# Patient Record
Sex: Female | Born: 1985 | Race: Black or African American | Hispanic: No | Marital: Single | Smoking: Current every day smoker
Health system: Southern US, Community
[De-identification: ages and names within clinical notes are randomized; demographics above are authoritative.]

## PROBLEM LIST (undated history)

## (undated) ENCOUNTER — Inpatient Hospital Stay (HOSPITAL_COMMUNITY): Payer: Self-pay

## (undated) DIAGNOSIS — F419 Anxiety disorder, unspecified: Secondary | ICD-10-CM

## (undated) DIAGNOSIS — A749 Chlamydial infection, unspecified: Secondary | ICD-10-CM

## (undated) HISTORY — PX: CHOLECYSTECTOMY, LAPAROSCOPIC: SHX56

## (undated) HISTORY — PX: CHOLECYSTECTOMY: SHX55

## (undated) HISTORY — PX: OTHER SURGICAL HISTORY: SHX169

---
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
## 2007-03-03 IMAGING — CR DG CHEST 2V
2 series · 2 of 2 positions shown · non-contrast
Comparison: none

CLINICAL DATA: Chest pain.  
 CHEST - 2 VIEW:
 Cardiomediastinal silhouette is unremarkable.  Mild peribronchial thickening is identified without focal air space disease.  No evidence of pleural effusion or pneumothorax.  The visualized bony thorax and upper abdomen are unremarkable.

[w chest pa *]
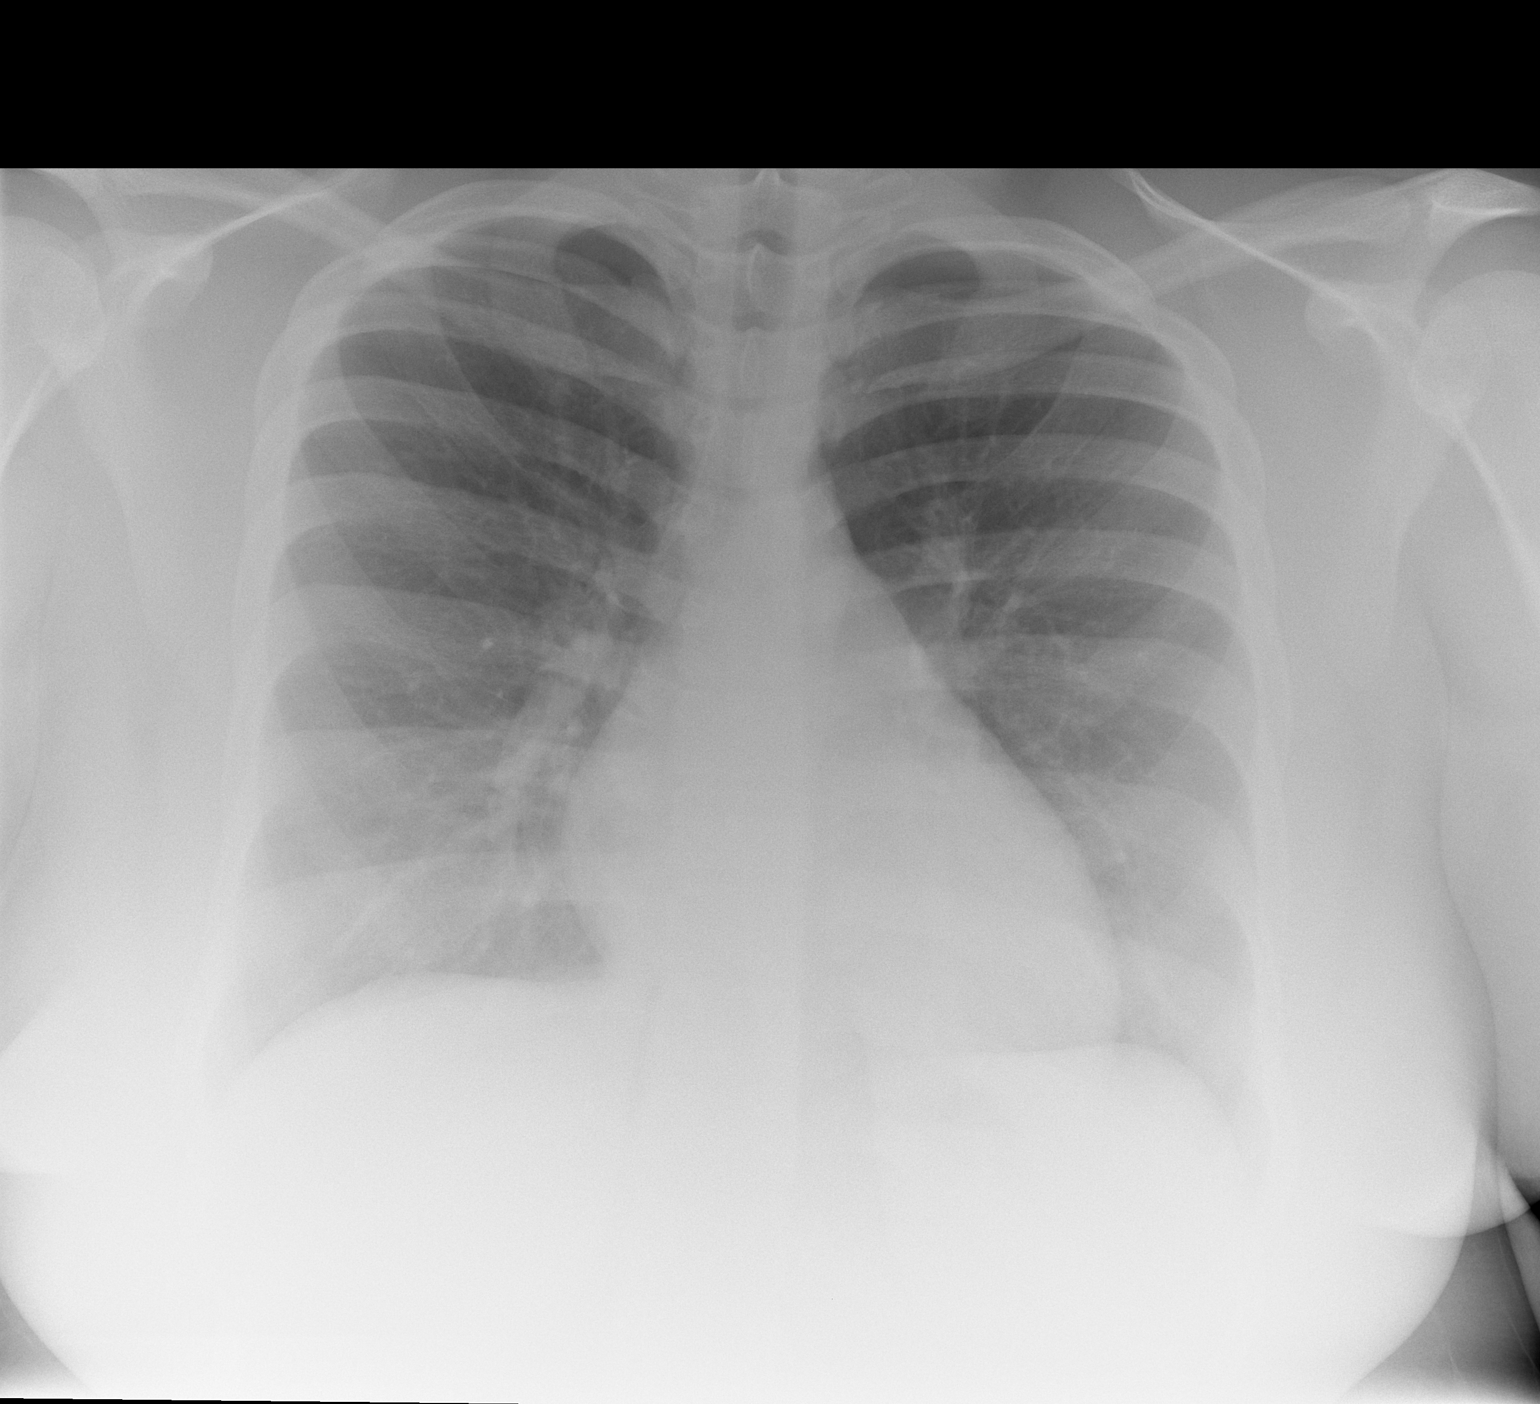

[w chest lat *]
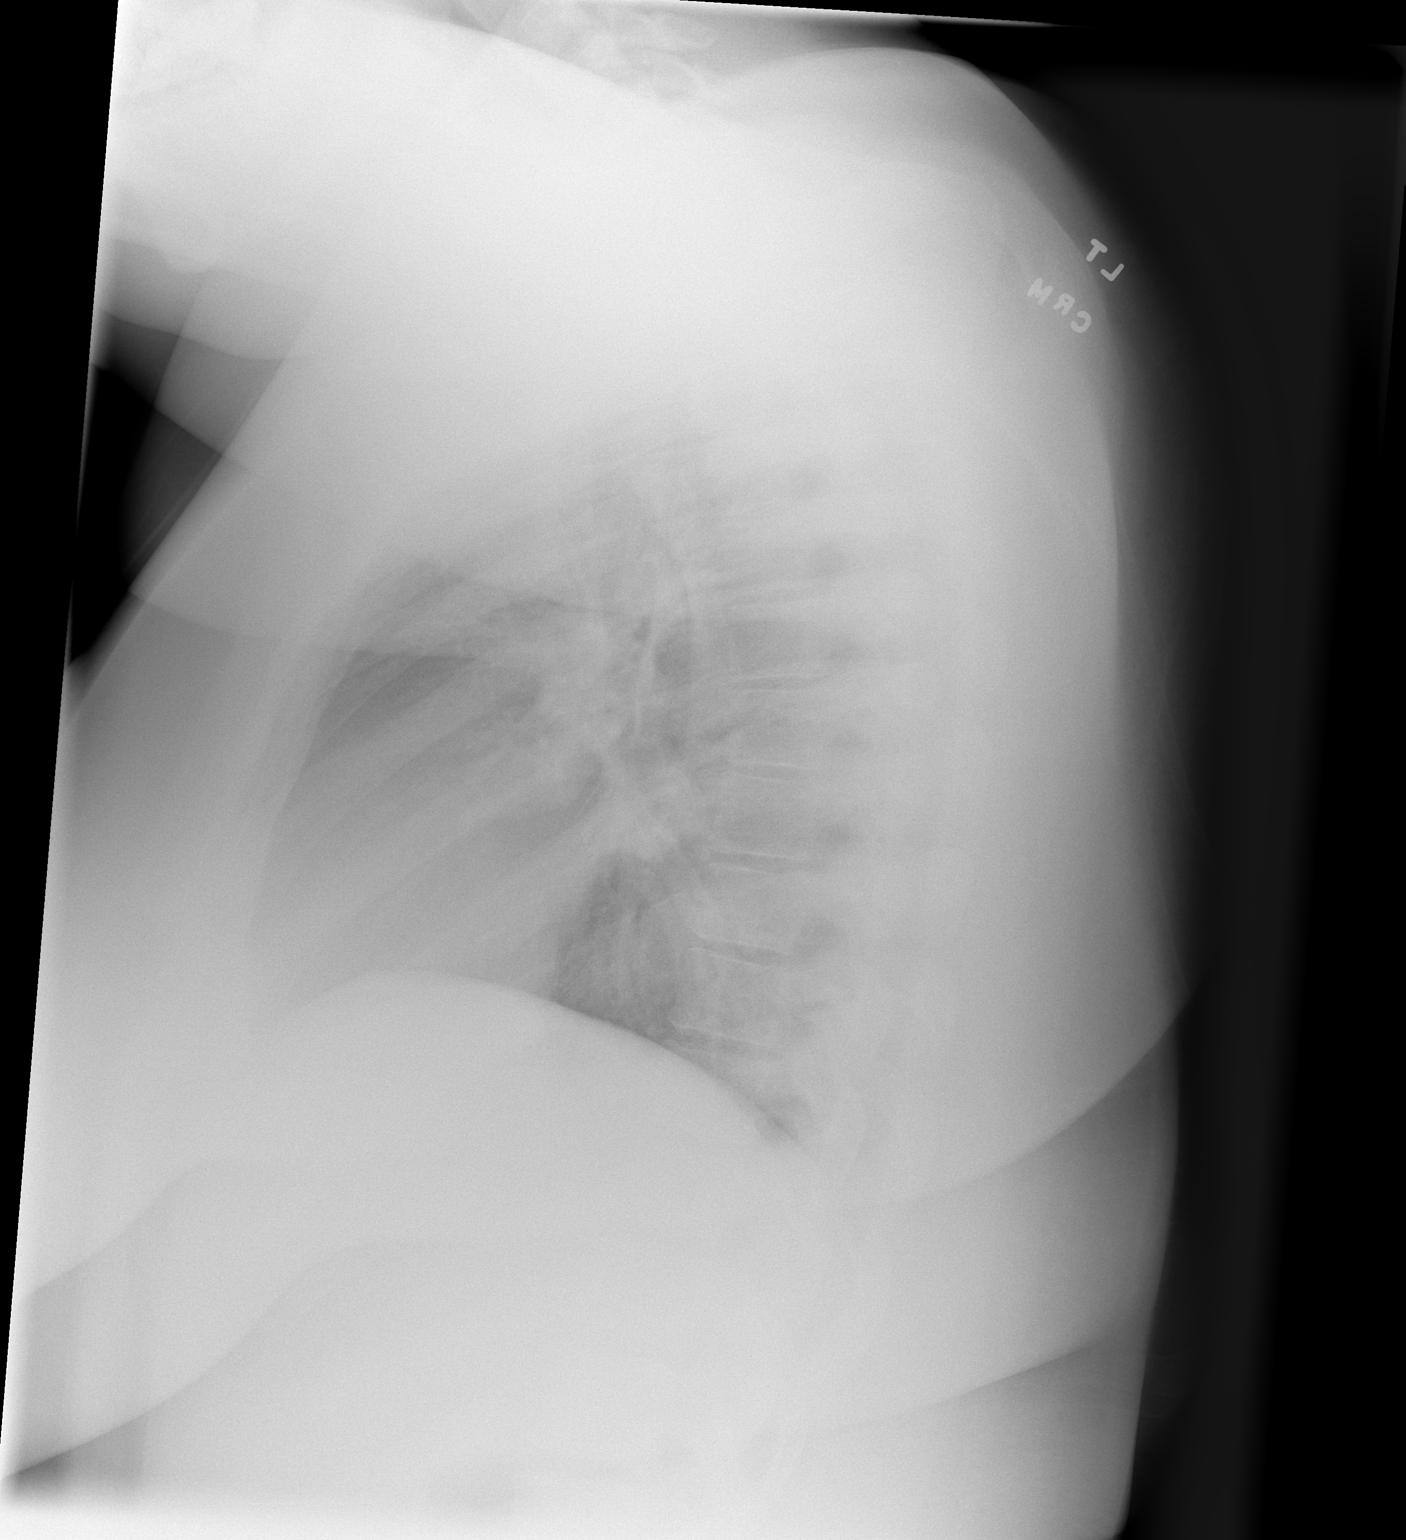

[2 of 2 positions shown; findings below may reference images not displayed]

IMPRESSION: Mild peribronchial thickening without focal air space disease.

## 2007-04-09 ENCOUNTER — Emergency Department (HOSPITAL_COMMUNITY): Admission: EM | Admit: 2007-04-09 | Discharge: 2007-04-09 | Payer: Self-pay | Admitting: *Deleted

## 2007-06-16 ENCOUNTER — Ambulatory Visit: Payer: Self-pay | Admitting: Family Medicine

## 2007-06-21 ENCOUNTER — Emergency Department (HOSPITAL_COMMUNITY): Admission: EM | Admit: 2007-06-21 | Discharge: 2007-06-21 | Payer: Self-pay | Admitting: Emergency Medicine

## 2007-08-22 ENCOUNTER — Emergency Department (HOSPITAL_COMMUNITY): Admission: EM | Admit: 2007-08-22 | Discharge: 2007-08-22 | Payer: Self-pay | Admitting: Emergency Medicine

## 2008-03-27 ENCOUNTER — Emergency Department (HOSPITAL_COMMUNITY): Admission: EM | Admit: 2008-03-27 | Discharge: 2008-03-28 | Payer: Self-pay | Admitting: Emergency Medicine

## 2008-05-25 ENCOUNTER — Inpatient Hospital Stay (HOSPITAL_COMMUNITY): Admission: AD | Admit: 2008-05-25 | Discharge: 2008-05-25 | Payer: Self-pay | Admitting: Family Medicine

## 2008-06-29 ENCOUNTER — Emergency Department (HOSPITAL_COMMUNITY): Admission: EM | Admit: 2008-06-29 | Discharge: 2008-06-29 | Payer: Self-pay | Admitting: Emergency Medicine

## 2009-01-12 ENCOUNTER — Inpatient Hospital Stay (HOSPITAL_COMMUNITY): Admission: AD | Admit: 2009-01-12 | Discharge: 2009-01-12 | Payer: Self-pay | Admitting: Obstetrics & Gynecology

## 2009-02-07 ENCOUNTER — Inpatient Hospital Stay (HOSPITAL_COMMUNITY): Admission: AD | Admit: 2009-02-07 | Discharge: 2009-02-07 | Payer: Self-pay | Admitting: Obstetrics & Gynecology

## 2009-03-14 ENCOUNTER — Ambulatory Visit (HOSPITAL_COMMUNITY): Admission: RE | Admit: 2009-03-14 | Discharge: 2009-03-14 | Payer: Self-pay | Admitting: Obstetrics

## 2009-06-16 ENCOUNTER — Inpatient Hospital Stay (HOSPITAL_COMMUNITY): Admission: AD | Admit: 2009-06-16 | Discharge: 2009-06-17 | Payer: Self-pay | Admitting: Obstetrics

## 2009-07-06 ENCOUNTER — Ambulatory Visit: Payer: Self-pay | Admitting: Physician Assistant

## 2009-07-06 ENCOUNTER — Inpatient Hospital Stay (HOSPITAL_COMMUNITY): Admission: AD | Admit: 2009-07-06 | Discharge: 2009-07-06 | Payer: Self-pay | Admitting: Obstetrics

## 2009-07-14 ENCOUNTER — Inpatient Hospital Stay (HOSPITAL_COMMUNITY): Admission: AD | Admit: 2009-07-14 | Discharge: 2009-07-14 | Payer: Self-pay | Admitting: Obstetrics

## 2009-07-22 ENCOUNTER — Inpatient Hospital Stay (HOSPITAL_COMMUNITY): Admission: AD | Admit: 2009-07-22 | Discharge: 2009-07-23 | Payer: Self-pay | Admitting: Obstetrics

## 2009-08-02 ENCOUNTER — Inpatient Hospital Stay (HOSPITAL_COMMUNITY): Admission: AD | Admit: 2009-08-02 | Discharge: 2009-08-02 | Payer: Self-pay | Admitting: Obstetrics

## 2009-08-06 ENCOUNTER — Inpatient Hospital Stay (HOSPITAL_COMMUNITY): Admission: AD | Admit: 2009-08-06 | Discharge: 2009-08-09 | Payer: Self-pay | Admitting: Obstetrics

## 2010-05-08 ENCOUNTER — Emergency Department (HOSPITAL_COMMUNITY): Admission: EM | Admit: 2010-05-08 | Discharge: 2010-05-08 | Payer: Self-pay | Admitting: Emergency Medicine

## 2010-06-19 ENCOUNTER — Emergency Department (HOSPITAL_COMMUNITY): Admission: EM | Admit: 2010-06-19 | Discharge: 2010-06-20 | Payer: Self-pay | Admitting: Emergency Medicine

## 2010-11-19 ENCOUNTER — Inpatient Hospital Stay (HOSPITAL_COMMUNITY)
Admission: AD | Admit: 2010-11-19 | Discharge: 2010-11-19 | Disposition: A | Discharge status: Home / Self Care | Payer: Medicaid Other | Source: Ambulatory Visit | Attending: Obstetrics and Gynecology | Admitting: Obstetrics and Gynecology

## 2010-11-19 DIAGNOSIS — N938 Other specified abnormal uterine and vaginal bleeding: Secondary | ICD-10-CM | POA: Insufficient documentation

## 2010-11-19 DIAGNOSIS — N949 Unspecified condition associated with female genital organs and menstrual cycle: Secondary | ICD-10-CM | POA: Insufficient documentation

## 2010-11-19 LAB — CBC
HCT: 34.8 % — ABNORMAL LOW (ref 36.0–46.0)
Hemoglobin: 11.4 g/dL — ABNORMAL LOW (ref 12.0–15.0)
MCH: 29.8 pg (ref 26.0–34.0)
MCHC: 32.8 g/dL (ref 30.0–36.0)
MCV: 91.1 fL (ref 78.0–100.0)
Platelets: 260 10*3/uL (ref 150–400)
RBC: 3.82 MIL/uL — ABNORMAL LOW (ref 3.87–5.11)
RDW: 12.5 % (ref 11.5–15.5)
WBC: 4.6 10*3/uL (ref 4.0–10.5)

## 2010-11-19 LAB — WET PREP, GENITAL
Trich, Wet Prep: NONE SEEN
Yeast Wet Prep HPF POC: NONE SEEN

## 2010-11-19 LAB — URINE MICROSCOPIC-ADD ON

## 2010-11-19 LAB — URINALYSIS, ROUTINE W REFLEX MICROSCOPIC
Bilirubin Urine: NEGATIVE
Glucose, UA: NEGATIVE mg/dL
Ketones, ur: NEGATIVE mg/dL
Leukocytes, UA: NEGATIVE
Nitrite: NEGATIVE
Protein, ur: NEGATIVE mg/dL
Specific Gravity, Urine: 1.025 (ref 1.005–1.030)
Urobilinogen, UA: 0.2 mg/dL (ref 0.0–1.0)
pH: 5.5 (ref 5.0–8.0)

## 2010-11-19 LAB — POCT PREGNANCY, URINE: Preg Test, Ur: NEGATIVE

## 2010-11-20 LAB — GC/CHLAMYDIA PROBE AMP, GENITAL
Chlamydia, DNA Probe: NEGATIVE
GC Probe Amp, Genital: NEGATIVE

## 2010-12-04 LAB — CBC
HCT: 30.1 % — ABNORMAL LOW (ref 36.0–46.0)
HCT: 34.9 % — ABNORMAL LOW (ref 36.0–46.0)
Hemoglobin: 10.2 g/dL — ABNORMAL LOW (ref 12.0–15.0)
Hemoglobin: 11.8 g/dL — ABNORMAL LOW (ref 12.0–15.0)
MCHC: 33.7 g/dL (ref 30.0–36.0)
MCHC: 33.9 g/dL (ref 30.0–36.0)
MCV: 93.8 fL (ref 78.0–100.0)
MCV: 94 fL (ref 78.0–100.0)
Platelets: 209 10*3/uL (ref 150–400)
Platelets: 242 10*3/uL (ref 150–400)
RBC: 3.21 MIL/uL — ABNORMAL LOW (ref 3.87–5.11)
RBC: 3.72 MIL/uL — ABNORMAL LOW (ref 3.87–5.11)
RDW: 13 % (ref 11.5–15.5)
RDW: 13 % (ref 11.5–15.5)
WBC: 11.5 10*3/uL — ABNORMAL HIGH (ref 4.0–10.5)
WBC: 9.7 10*3/uL (ref 4.0–10.5)

## 2010-12-04 LAB — URINALYSIS, ROUTINE W REFLEX MICROSCOPIC
Bilirubin Urine: NEGATIVE
Glucose, UA: NEGATIVE mg/dL
Ketones, ur: NEGATIVE mg/dL
Nitrite: NEGATIVE
Protein, ur: NEGATIVE mg/dL
Specific Gravity, Urine: 1.015 (ref 1.005–1.030)
Urobilinogen, UA: 0.2 mg/dL (ref 0.0–1.0)
pH: 6.5 (ref 5.0–8.0)

## 2010-12-04 LAB — URINE MICROSCOPIC-ADD ON

## 2010-12-04 LAB — CCBB MATERNAL DONOR DRAW

## 2010-12-04 LAB — RPR: RPR Ser Ql: NONREACTIVE

## 2010-12-06 LAB — URINALYSIS, ROUTINE W REFLEX MICROSCOPIC
Bilirubin Urine: NEGATIVE
Glucose, UA: NEGATIVE mg/dL
Ketones, ur: NEGATIVE mg/dL
Leukocytes, UA: NEGATIVE
Nitrite: NEGATIVE
Protein, ur: NEGATIVE mg/dL
Specific Gravity, Urine: 1.015 (ref 1.005–1.030)
Urobilinogen, UA: 0.2 mg/dL (ref 0.0–1.0)
pH: 7 (ref 5.0–8.0)

## 2010-12-06 LAB — URINE MICROSCOPIC-ADD ON

## 2010-12-06 LAB — URINE CULTURE: Colony Count: 10000

## 2010-12-06 LAB — CBC
MCHC: 34.3 g/dL (ref 30.0–36.0)
RBC: 3.32 MIL/uL — ABNORMAL LOW (ref 3.87–5.11)
WBC: 8.9 10*3/uL (ref 4.0–10.5)

## 2010-12-10 LAB — URINALYSIS, ROUTINE W REFLEX MICROSCOPIC
Glucose, UA: NEGATIVE mg/dL
Protein, ur: NEGATIVE mg/dL

## 2010-12-10 LAB — WET PREP, GENITAL

## 2010-12-11 LAB — ABO/RH: ABO/RH(D): O POS

## 2010-12-11 LAB — URINALYSIS, ROUTINE W REFLEX MICROSCOPIC
Bilirubin Urine: NEGATIVE
Glucose, UA: NEGATIVE mg/dL
Ketones, ur: NEGATIVE mg/dL
Nitrite: NEGATIVE
pH: 6.5 (ref 5.0–8.0)

## 2010-12-11 LAB — WET PREP, GENITAL
Trich, Wet Prep: NONE SEEN
Yeast Wet Prep HPF POC: NONE SEEN

## 2010-12-11 LAB — URINE MICROSCOPIC-ADD ON

## 2010-12-11 LAB — CBC
Hemoglobin: 11.8 g/dL — ABNORMAL LOW (ref 12.0–15.0)
RBC: 3.55 MIL/uL — ABNORMAL LOW (ref 3.87–5.11)

## 2010-12-11 LAB — GC/CHLAMYDIA PROBE AMP, GENITAL: GC Probe Amp, Genital: NEGATIVE

## 2011-01-18 NOTE — Op Note (Signed)
NAMEALFRIEDA, Bridget Wilson            ACCOUNT NO.:  [id number]   MEDICAL RECORD NO.:  [id number]          PATIENT TYPE:  AMB   LOCATION:  DAY                          FACILITY:  [facility]   PHYSICIAN:  Ollen Gross. Vernell Morgans, M.D. DATE OF BIRTH:  March 06, 1986   DATE OF PROCEDURE:  10/28/2005  DATE OF DISCHARGE:                                 OPERATIVE REPORT   PREOPERATIVE DIAGNOSIS:  Gallstones.   POSTOPERATIVE DIAGNOSIS:  Gallstones.   PROCEDURES:  Laparoscopic cholecystectomy with intraoperative cholangiogram.   SURGEON:  Ollen Gross. Carolynne Edouard, M.D.   ASSISTANT:  Sandria Bales. Ezzard Standing, M.D.   ANESTHESIA:  General endotracheal.   PROCEDURE:  After informed consent was obtained, the patient was brought to  the operating room, placed in a supine position on the operating room table.  After adequate induction of general anesthesia, the patient's abdomen was  prepped with Betadine and draped in the usual sterile manner. The area below  the umbilicus was infiltrated 0.25% Marcaine. A small incision was made with  a 15 blade knife. This incision was carried down through the subcutaneous  tissue bluntly with a hemostat and [employer] retractors until the linea alba  was identified. The linea alba was incised with a 15 blade knife and each  side was grasped with Kocher clamps and elevated anteriorly. The  preperitoneal space was then probed bluntly with a hemostat until the  peritoneum was opened and access was gained to the abdominal cavity. A #0  Vicryl pursestring stitch was placed in the fascia surround the opening. A  Hasson cannula was then placed through the opening and anchored in place to  the previously placed Vicryl pursestring stitch. The abdomen was then  insufflated with carbon dioxide without difficulty. The patient was placed  in the head-up position and rotated slightly with the right side up. The  laparoscope was then inserted through the Hasson cannula and right upper  quadrant was inspected.  The dome of the gallbladder and liver were readily  identified. Next the epigastric region was infiltrated with 0.25% Marcaine  and a small incision was made with a 15 blade knife and a 10 mm port was  placed bluntly through this incision in the abdominal cavity under direct  vision. Sites were then chosen laterally on the right side of the abdomen  for placement of 5 mm ports. Each of these areas were infiltrated with 0.25%  Marcaine. Small stab incisions were made with a 15 blade knife and 5 mm  ports were placed bluntly through these incisions into the abdominal cavity  under direct vision. A blunt grasper was then placed through the lateral-  most 5-mm port and used to grasp the dome of the gallbladder and elevated  anteriorly and superiorly. Another blunt grasper was placed through the  other 5 mm port and used to retract on the body and neck of the gallbladder.  A dissector was placed through the epigastric port and using the  electrocautery, the peritoneal reflection at the gallbladder neck area was  opened, blunt dissection was then carried out in this area until the  gallbladder neck cystic  duct junction was readily identified and a good  window was created. A single clip as placed on the gallbladder neck, a small  ductotomy was made just below the clip with the laparoscopic scissors. A 14  gauge Angiocath was then placed percutaneously through the anterior  abdominal wall under direct vision. A Reddick cholangiogram catheter was  then placed through the Angiocath and flushed. The Reddick catheter was then  placed within the cystic duct and anchored in place with a clip. A  cholangiogram was obtained that showed no filling defects, good emptying  into the duodenum and adequate length on the cystic duct. The anchoring clip  and catheters are removed from the patient. Two clips were placed proximally  on the cystic duct and the duct was divided between the 2 sets of clips.  Posterior  to this, the cystic artery was identified and again dissected  bluntly in a circumferential manner until a good window was created. Two  clips were placed proximally and one distally in the artery and the artery  was divided between the two. Next a laparoscopic hook cautery device was  used to separate the gallbladder from the liver bed. Prior to completely  detaching the gallbladder from liver bed, the liver bed was inspected and  several small bleeding points were coagulated with the electrocautery until  the area was completely hemostatic. The gallbladder was then detached the  rest of the way from the liver bed without difficulty with the hook  electrocautery. The laparoscope was then moved to the epigastric port and a  gallbladder grasper was placed through the Hasson cannula and used to grasp  the neck of the gallbladder. The gallbladder with the Hasson cannula was  removed through the infraumbilical port without difficulty. The gallbladder  did require opening and removal of several stones within the gallbladder  before it would completely pull through the infraumbilical opening. This was  done without difficulty. Once this was accomplished, the infraumbilical  incision was then irrigated with copious amounts of saline. The fascial  defect was closed with an interrupted #0 Vicryl as well as with the #0  Vicryl pursestring stitch. The abdomen was then irrigated with copious  amounts of saline until the effluent was clear. The rest of the ports were  then removed under direct vision and were all found to be hemostatic. The  gas was allowed to escape. The skin incisions were all closed with  interrupted 4-0 Monocryl subcuticular stitches. Benzoin and Steri-Strips and  sterile dressings were applied. The patient tolerated the procedure well. At  the end of the case, all needle, sponge and instrument counts . The patient was then awakened and taken to the recovery in stable  condition.      Ollen Gross. Vernell Morgans, M.D.  Electronically Signed     PST/MEDQ  D:  10/28/2005  T:  10/29/2005  Job:  [id number]

## 2011-05-31 LAB — URINE MICROSCOPIC-ADD ON

## 2011-05-31 LAB — URINALYSIS, ROUTINE W REFLEX MICROSCOPIC
Bilirubin Urine: NEGATIVE
Nitrite: NEGATIVE
Specific Gravity, Urine: 1.027
pH: 6

## 2011-06-03 LAB — URINALYSIS, ROUTINE W REFLEX MICROSCOPIC
Bilirubin Urine: NEGATIVE
Glucose, UA: NEGATIVE
Glucose, UA: NEGATIVE
Ketones, ur: NEGATIVE
Ketones, ur: NEGATIVE
Leukocytes, UA: NEGATIVE
Protein, ur: NEGATIVE
Specific Gravity, Urine: 1.02
pH: 6

## 2011-06-03 LAB — URINE MICROSCOPIC-ADD ON

## 2011-06-03 LAB — DIFFERENTIAL
Basophils Absolute: 0
Eosinophils Relative: 1
Lymphocytes Relative: 41
Lymphs Abs: 1.4
Monocytes Absolute: 0.1
Neutro Abs: 1.9

## 2011-06-03 LAB — COMPREHENSIVE METABOLIC PANEL
AST: 58 — ABNORMAL HIGH
Albumin: 3.8
BUN: 6
Calcium: 9.1
Creatinine, Ser: 0.69
GFR calc Af Amer: >60

## 2011-06-03 LAB — LIPASE, BLOOD: Lipase: 16

## 2011-06-03 LAB — CBC
HCT: 36
MCHC: 34
MCV: 92.7
Platelets: 275
RDW: 13
WBC: 3.5 — ABNORMAL LOW

## 2011-06-03 LAB — GC/CHLAMYDIA PROBE AMP, GENITAL: Chlamydia, DNA Probe: POSITIVE — AB

## 2011-06-03 LAB — WET PREP, GENITAL: Trich, Wet Prep: NONE SEEN

## 2011-06-17 ENCOUNTER — Inpatient Hospital Stay (HOSPITAL_COMMUNITY)
Admission: AD | Admit: 2011-06-17 | Discharge: 2011-06-18 | Disposition: A | Discharge status: Home / Self Care | Payer: Medicaid Other | Source: Ambulatory Visit | Attending: Family Medicine | Admitting: Family Medicine

## 2011-06-17 ENCOUNTER — Inpatient Hospital Stay (HOSPITAL_COMMUNITY): Payer: Medicaid Other

## 2011-06-17 DIAGNOSIS — A499 Bacterial infection, unspecified: Secondary | ICD-10-CM | POA: Insufficient documentation

## 2011-06-17 DIAGNOSIS — O2390 Unspecified genitourinary tract infection in pregnancy, unspecified trimester: Secondary | ICD-10-CM | POA: Insufficient documentation

## 2011-06-17 DIAGNOSIS — O200 Threatened abortion: Secondary | ICD-10-CM

## 2011-06-17 DIAGNOSIS — B9689 Other specified bacterial agents as the cause of diseases classified elsewhere: Secondary | ICD-10-CM | POA: Insufficient documentation

## 2011-06-17 DIAGNOSIS — N760 Acute vaginitis: Secondary | ICD-10-CM | POA: Insufficient documentation

## 2011-06-17 LAB — POCT PREGNANCY, URINE: Preg Test, Ur: POSITIVE

## 2011-06-17 LAB — DIFFERENTIAL
Lymphocytes Relative: 40 % (ref 12–46)
Lymphs Abs: 2.9 10*3/uL (ref 0.7–4.0)
Monocytes Relative: 9 % (ref 3–12)
Neutro Abs: 3.7 10*3/uL (ref 1.7–7.7)
Neutrophils Relative %: 51 % (ref 43–77)

## 2011-06-17 LAB — ABO/RH: ABO/RH(D): O POS

## 2011-06-17 LAB — URINALYSIS, ROUTINE W REFLEX MICROSCOPIC
Ketones, ur: NEGATIVE mg/dL
Protein, ur: NEGATIVE mg/dL
Urobilinogen, UA: 0.2 mg/dL (ref 0.0–1.0)

## 2011-06-17 LAB — URINE MICROSCOPIC-ADD ON

## 2011-06-17 LAB — WET PREP, GENITAL

## 2011-06-17 LAB — CBC
Hemoglobin: 11.5 g/dL — ABNORMAL LOW (ref 12.0–15.0)
RBC: 3.75 MIL/uL — ABNORMAL LOW (ref 3.87–5.11)
WBC: 7.3 10*3/uL (ref 4.0–10.5)

## 2011-06-17 LAB — HCG, QUANTITATIVE, PREGNANCY: hCG, Beta Chain, Quant, S: 13856 m[IU]/mL — ABNORMAL HIGH (ref <5)

## 2011-06-17 NOTE — Progress Notes (Signed)
Pt reports vaginal bleeding x 2 weeks, states at first she thought it was her period because it was time for her period but the bleeding just continued , using 3 tampons per day. Had a positive preg test about a week ago at home. LNMP 05/08/2011. G2 P1

## 2011-06-17 NOTE — ED Provider Notes (Signed)
History     CSN: [id number] Arrival date & time: 06/17/2011  9:22 PM  Chief Complaint  Patient presents with  . Possible Pregnancy    HPI Bridget Wilson is a 25 y.o. female who presents to MAU for cramping and  bleeding Sept. 30th and has had spotting off and on since then. Positive home pregnancy test. Patient has been using condoms "just about every time".  Denies nausea or vomiting.   No past medical history on file.  No past surgical history on file.  No family history on file.  History  Substance Use Topics  . Smoking status: Not on file  . Smokeless tobacco: Not on file  . Alcohol Use: Not on file    OB History    No data available      Review of Systems  Constitutional: Negative for fever, chills, diaphoresis and fatigue.  HENT: Negative for ear pain, congestion, sore throat, facial swelling, neck pain, neck stiffness, dental problem and sinus pressure.   Eyes: Negative for photophobia, pain and discharge.  Respiratory: Negative for cough, chest tightness and wheezing.   Cardiovascular: Negative.   Gastrointestinal: Positive for abdominal pain. Negative for nausea, vomiting, diarrhea, constipation and abdominal distention.       Cramping  Genitourinary: Positive for frequency and vaginal bleeding. Negative for dysuria, flank pain and difficulty urinating.  Musculoskeletal: Positive for back pain. Negative for myalgias and gait problem.  Skin: Negative for color change and rash.  Neurological: Negative for dizziness, speech difficulty, weakness, light-headedness, numbness and headaches.  Psychiatric/Behavioral: Negative for confusion and agitation.       Patient states she has been anxious and depressed.    Allergies  Review of patient's allergies indicates no known allergies.  Home Medications  No current outpatient prescriptions on file.  BP 126/65  Pulse 70  Temp 99.1 F (37.3 C)  Resp 16  Ht 5' 7.5" (1.715 m)  Wt 232 lb (105.235 kg)  BMI 35.80  kg/m2  LMP 06/07/2011  Physical Exam  Nursing note and vitals reviewed. Constitutional: She is oriented to person, place, and time. She appears well-developed and well-nourished.  HENT:  Head: Normocephalic.  Eyes: EOM are normal.  Neck: Neck supple.  Cardiovascular: Normal rate.   Pulmonary/Chest: Effort normal.  Abdominal: Soft. There is no tenderness.  Genitourinary:       Creamy discharge vaginal vault. Cervix friable, closed and long. No adnexal mass or tenderness palpated. Uterus approximately 8 week size.  Musculoskeletal: Normal range of motion.  Neurological: She is alert and oriented to person, place, and time. No cranial nerve deficit.  Skin: Skin is warm and dry.   Care turned over to [place] CNM  ED Course  Procedures  MDM   Kerrie Buffalo, NP 06/17/11 2230  Results for orders placed during the hospital encounter of 06/17/11 (from the past 24 hour(s))  URINALYSIS, ROUTINE W REFLEX MICROSCOPIC     Status: Abnormal   Collection Time   06/17/11  9:40 PM      Component Value Range   Color, Urine YELLOW  YELLOW    Appearance CLEAR  CLEAR    Specific Gravity, Urine 1.025  1.005 - 1.030    pH 7.5  5.0 - 8.0    Glucose, UA NEGATIVE  NEGATIVE (mg/dL)   Hgb urine dipstick SMALL (*) NEGATIVE    Bilirubin Urine NEGATIVE  NEGATIVE    Ketones, ur NEGATIVE  NEGATIVE (mg/dL)   Protein, ur NEGATIVE  NEGATIVE (mg/dL)  Urobilinogen, UA 0.2  0.0 - 1.0 (mg/dL)   Nitrite NEGATIVE  NEGATIVE    Leukocytes, UA TRACE (*) NEGATIVE   URINE MICROSCOPIC-ADD ON     Status: Abnormal   Collection Time   06/17/11  9:40 PM      Component Value Range   Squamous Epithelial / LPF FEW (*) RARE    WBC, UA 3-6  <3 (WBC/hpf)   RBC / HPF 3-6  <3 (RBC/hpf)   Urine-Other MUCOUS PRESENT    POCT PREGNANCY, URINE     Status: Normal   Collection Time   06/17/11 10:05 PM      Component Value Range   Preg Test, Ur POSITIVE    ABO/RH     Status: Normal   Collection Time   06/17/11 10:17 PM        Component Value Range   ABO/RH(D) O POS    CBC     Status: Abnormal   Collection Time   06/17/11 10:18 PM      Component Value Range   WBC 7.3  4.0 - 10.5 (K/uL)   RBC 3.75 (*) 3.87 - 5.11 (MIL/uL)   Hemoglobin 11.5 (*) 12.0 - 15.0 (g/dL)   HCT 16.1 (*) 09.6 - 46.0 (%)   MCV 90.4  78.0 - 100.0 (fL)   MCH 30.7  26.0 - 34.0 (pg)   MCHC 33.9  30.0 - 36.0 (g/dL)   RDW 04.5  40.9 - 81.1 (%)   Platelets 274  150 - 400 (K/uL)  DIFFERENTIAL     Status: Normal   Collection Time   06/17/11 10:18 PM      Component Value Range   Neutrophils Relative 51  43 - 77 (%)   Neutro Abs 3.7  1.7 - 7.7 (K/uL)   Lymphocytes Relative 40  12 - 46 (%)   Lymphs Abs 2.9  0.7 - 4.0 (K/uL)   Monocytes Relative 9  3 - 12 (%)   Monocytes Absolute 0.6  0.1 - 1.0 (K/uL)   Eosinophils Relative 1  0 - 5 (%)   Eosinophils Absolute 0.1  0.0 - 0.7 (K/uL)   Basophils Relative 0  0 - 1 (%)   Basophils Absolute 0.0  0.0 - 0.1 (K/uL)  HCG, QUANTITATIVE, PREGNANCY     Status: Abnormal   Collection Time   06/17/11 10:18 PM      Component Value Range   hCG, Beta Chain, Quant, S 13856 (*) <5 (mIU/mL)  WET PREP, GENITAL     Status: Abnormal   Collection Time   06/17/11 10:30 PM      Component Value Range   Yeast, Wet Prep NONE SEEN  NONE SEEN    Trich, Wet Prep NONE SEEN  NONE SEEN    Clue Cells, Wet Prep FEW (*) NONE SEEN    WBC, Wet Prep HPF POC MODERATE (*) NONE SEEN    US Ob Comp Less 14 Wks  06/17/2011  *RADIOLOGY REPORT*  Clinical Data: bleeding and cramping in early pregnancy,bleeding; ;  OBSTETRIC <14 WK [place] AND TRANSVAGINAL OB US  Technique: Both transabdominal and transvaginal ultrasound examinations were performed for complete evaluation of the gestation as well as the maternal uterus, adnexal regions, and pelvic cul-de-sac.  Comparison: None.  Findings: There is a single intrauterine gestation.  Mean sac diameter is 15 mm for an  estimated gestational age of [redacted] weeks 2 days.  No fetal pole.  There is a  yolk sac visualized.  No subchorionic hemorrhage.  Right corpus luteal cyst.  Left ovary unremarkable.  No free fluid.  IMPRESSION: 6-week-2-day intrauterine pregnancy.  Yolk sac visualized.  No fetal pole currently.  Original Report Authenticated By: Cyndie Chime, M.D.   US Ob Transvaginal  06/17/2011  *RADIOLOGY REPORT*  Clinical Data: bleeding and cramping in early pregnancy,bleeding; ;  OBSTETRIC <14 WK [place] AND TRANSVAGINAL OB US  Technique: Both transabdominal and transvaginal ultrasound examinations were performed for complete evaluation of the gestation as well as the maternal uterus, adnexal regions, and pelvic cul-de-sac.  Comparison: None.  Findings: There is a single intrauterine gestation.  Mean sac diameter is 15 mm for an  estimated gestational age of [redacted] weeks 2 days.  No fetal pole.  There is a yolk sac visualized.  No subchorionic hemorrhage.  Right corpus luteal cyst.  Left ovary unremarkable.  No free fluid.  IMPRESSION: 6-week-2-day intrauterine pregnancy.  Yolk sac visualized.  No fetal pole currently.  Original Report Authenticated By: Cyndie Chime, M.D.   Assessment: 1. 6.2 week IUP +YS, no FP seen. threatened AB 2. Rh pos 3. BV  Plan: 1. D/C home 2. F/U in MAU in 1 week for [place] for viability 3. SAB precautions 4. RX Flagyl. First dose given.  Katrinka Blazing, Dmonte Maher 06/18/2011 12:21 AM

## 2011-06-17 NOTE — Progress Notes (Signed)
Pt also reports abd cramping since bleeding started

## 2011-06-17 NOTE — Progress Notes (Signed)
Pt in tears and anxious about the positive pregnancy test.  She states she will have an abortion if it's early enough.

## 2011-06-17 NOTE — Progress Notes (Signed)
Pt c/o bleeding since the 30th of September.  Had a normal period on Sep 5th.  Cramping/low back pains and bleeding off and on for the past 15 days.  No clots. Has not taken anything for the pain today.

## 2011-06-18 LAB — GC/CHLAMYDIA PROBE AMP, GENITAL: GC Probe Amp, Genital: NEGATIVE

## 2011-06-18 MED ORDER — METRONIDAZOLE 500 MG PO TABS: 500.0000 mg | ORAL_TABLET | Freq: Two times a day (BID) | ORAL | Status: DC

## 2011-06-18 MED ORDER — OXYCODONE-ACETAMINOPHEN 5-325 MG PO TABS
2.0000 | ORAL_TABLET | Freq: Once | ORAL | Status: AC
  Administered 2011-06-18: 2 via ORAL
  Filled 2011-06-18: qty 2

## 2011-06-18 MED ORDER — METRONIDAZOLE 500 MG PO TABS
500.0000 mg | ORAL_TABLET | Freq: Once | ORAL | Status: AC
  Administered 2011-06-18: 500 mg via ORAL
  Filled 2011-06-18: qty 1

## 2011-06-18 NOTE — ED Provider Notes (Signed)
Chart reviewed and agree with management and plan.  

## 2011-06-19 ENCOUNTER — Telehealth (HOSPITAL_COMMUNITY): Payer: Self-pay | Admitting: *Deleted

## 2011-06-19 NOTE — Telephone Encounter (Signed)
Telephone call to patient regarding positive chlamydia culture, patient' phone does not accept incoming calls.  Certified letter mailed.  Patient has not been treated and will need referral to [facility] clinic at [phone].  Report faxed to health department.

## 2011-06-21 ENCOUNTER — Telehealth: Payer: Self-pay | Admitting: Advanced Practice Midwife

## 2011-06-21 NOTE — Telephone Encounter (Signed)
Pt received letter informing she needed to call for lab results. I informed her of positive chlamydia result. She is coming here for f/u on Monday, she requests that she receive treatment at that time.

## 2011-06-24 ENCOUNTER — Encounter (HOSPITAL_COMMUNITY): Payer: Self-pay | Admitting: *Deleted

## 2011-06-24 ENCOUNTER — Inpatient Hospital Stay (HOSPITAL_COMMUNITY): Payer: Medicaid Other

## 2011-06-24 ENCOUNTER — Inpatient Hospital Stay (HOSPITAL_COMMUNITY)
Admission: AD | Admit: 2011-06-24 | Discharge: 2011-06-24 | Disposition: A | Discharge status: Home / Self Care | Payer: Medicaid Other | Source: Ambulatory Visit | Attending: Obstetrics & Gynecology | Admitting: Obstetrics & Gynecology

## 2011-06-24 DIAGNOSIS — N739 Female pelvic inflammatory disease, unspecified: Secondary | ICD-10-CM | POA: Insufficient documentation

## 2011-06-24 DIAGNOSIS — O98319 Other infections with a predominantly sexual mode of transmission complicating pregnancy, unspecified trimester: Secondary | ICD-10-CM | POA: Insufficient documentation

## 2011-06-24 DIAGNOSIS — A5619 Other chlamydial genitourinary infection: Secondary | ICD-10-CM | POA: Insufficient documentation

## 2011-06-24 DIAGNOSIS — O26859 Spotting complicating pregnancy, unspecified trimester: Secondary | ICD-10-CM

## 2011-06-24 HISTORY — DX: Anxiety disorder, unspecified: F41.9

## 2011-06-24 HISTORY — DX: Chlamydial infection, unspecified: A74.9

## 2011-06-24 MED ORDER — AZITHROMYCIN 1 G PO PACK
1.0000 g | PACK | Freq: Once | ORAL | Status: AC
  Administered 2011-06-24: 1 g via ORAL
  Filled 2011-06-24: qty 1

## 2011-06-24 NOTE — ED Provider Notes (Signed)
History     No chief complaint on file.  HPI Here last week with spotting, IUGS with yolk sac on u/s, follow up was scheduled for today. Spotting has continued, no pain. Was also + for chlamydia then, has not been treated.   OB History    Grav Para Term Preterm Abortions TAB SAB Ect Mult Living   2 1 1  0 0 0 0 0 0 1      Past Medical History  Diagnosis Date  . Anxiety     little bit  . Chlamydia     Past Surgical History  Procedure Date  . Cholecystectomy, laparoscopic     No family history on file.  History  Substance Use Topics  . Smoking status: Former Games developer  . Smokeless tobacco: Never Used   Comment: quit 08/12  . Alcohol Use: No    Allergies: No Known Allergies  Prescriptions prior to admission  Medication Sig Dispense Refill  . acetaminophen (TYLENOL) 500 MG tablet Take 500 mg by mouth every 6 (six) hours as needed. For headache       . diazepam (VALIUM) 2 MG tablet Take 2 mg by mouth every 6 (six) hours as needed. For anxiety       . metroNIDAZOLE (FLAGYL) 500 MG tablet Take 500 mg by mouth 2 (two) times daily. Pt has not started this medication yet       . DISCONTD: metroNIDAZOLE (FLAGYL) 500 MG tablet Take 1 tablet (500 mg total) by mouth 2 (two) times daily.  14 tablet  0    Review of Systems  Constitutional: Negative.   Respiratory: Negative.   Cardiovascular: Negative.   Gastrointestinal: Negative for nausea, vomiting, abdominal pain, diarrhea and constipation.  Genitourinary: Negative for dysuria, urgency, frequency, hematuria and flank pain.       Positive for vaginal bleeding  Musculoskeletal: Negative.   Neurological: Negative.   Psychiatric/Behavioral: Negative.    Physical Exam   Blood pressure 125/49, pulse 60, temperature 99.1 F (37.3 C), temperature source Oral, resp. rate 20, height 5\' 7"  (1.702 m), weight 105.235 kg (232 lb), last menstrual period 05/11/2011.  Physical Exam  Constitutional: She is oriented to person, place, and  time. She appears well-developed and well-nourished. No distress.  Cardiovascular: Normal rate.   Respiratory: Effort normal.  Musculoskeletal: Normal range of motion.  Neurological: She is alert and oriented to person, place, and time.  Skin: Skin is warm.  Psychiatric: She has a normal mood and affect.    MAU Course  Procedures US Ob Comp Less 14 Wks  06/17/2011  *RADIOLOGY REPORT*  Clinical Data: bleeding and cramping in early pregnancy,bleeding; ;  OBSTETRIC <14 WK [place] AND TRANSVAGINAL OB US  Technique: Both transabdominal and transvaginal ultrasound examinations were performed for complete evaluation of the gestation as well as the maternal uterus, adnexal regions, and pelvic cul-de-sac.  Comparison: None.  Findings: There is a single intrauterine gestation.  Mean sac diameter is 15 mm for an  estimated gestational age of [redacted] weeks 2 days.  No fetal pole.  There is a yolk sac visualized.  No subchorionic hemorrhage.  Right corpus luteal cyst.  Left ovary unremarkable.  No free fluid.  IMPRESSION: 6-week-2-day intrauterine pregnancy.  Yolk sac visualized.  No fetal pole currently.  Original Report Authenticated By: Cyndie Chime, M.D.   US Ob Transvaginal  06/24/2011  OBSTETRICAL ULTRASOUND: This exam was performed within a [facility]. The OB US report was generated in the [facility]  system, and faxed to the ordering physician.   This report is also available in [employer] and in the [employer]. See [facility] Obstetric US report.   US Ob Transvaginal  06/17/2011  *RADIOLOGY REPORT*  Clinical Data: bleeding and cramping in early pregnancy,bleeding; ;  OBSTETRIC <14 WK [place] AND TRANSVAGINAL OB US  Technique: Both transabdominal and transvaginal ultrasound examinations were performed for complete evaluation of the gestation as well as the maternal uterus, adnexal regions, and pelvic cul-de-sac.  Comparison: None.  Findings: There is a single intrauterine gestation.   Mean sac diameter is 15 mm for an  estimated gestational age of [redacted] weeks 2 days.  No fetal pole.  There is a yolk sac visualized.  No subchorionic hemorrhage.  Right corpus luteal cyst.  Left ovary unremarkable.  No free fluid.  IMPRESSION: 6-week-2-day intrauterine pregnancy.  Yolk sac visualized.  No fetal pole currently.  Original Report Authenticated By: Cyndie Chime, M.D.   Assessment and Plan  Treated for Chlamydia with Azithromycin 1 gm PO Pt plans to terminate pregnancy - gave info for [facility] and pregnancy verification in case she decides to continue with pregnancy  FRAZIER,NATALIE 06/24/2011, 11:58 AM

## 2011-06-24 NOTE — Progress Notes (Signed)
Here last wk, found out preg. [place] didn't show FH and was measuring not as far as expected.  Received call tested + for chlamydia. No cramping.  Has continued to have bleeding.

## 2011-08-01 ENCOUNTER — Inpatient Hospital Stay (HOSPITAL_COMMUNITY)
Admission: AD | Admit: 2011-08-01 | Discharge: 2011-08-01 | Disposition: A | Discharge status: Home / Self Care | Payer: Medicaid Other | Source: Ambulatory Visit | Attending: Obstetrics | Admitting: Obstetrics

## 2011-08-01 ENCOUNTER — Encounter (HOSPITAL_COMMUNITY): Payer: Self-pay | Admitting: *Deleted

## 2011-08-01 DIAGNOSIS — R109 Unspecified abdominal pain: Secondary | ICD-10-CM | POA: Insufficient documentation

## 2011-08-01 DIAGNOSIS — K5289 Other specified noninfective gastroenteritis and colitis: Secondary | ICD-10-CM | POA: Insufficient documentation

## 2011-08-01 DIAGNOSIS — K529 Noninfective gastroenteritis and colitis, unspecified: Secondary | ICD-10-CM

## 2011-08-01 DIAGNOSIS — O99891 Other specified diseases and conditions complicating pregnancy: Secondary | ICD-10-CM | POA: Insufficient documentation

## 2011-08-01 LAB — URINALYSIS, ROUTINE W REFLEX MICROSCOPIC
Bilirubin Urine: NEGATIVE
Glucose, UA: NEGATIVE mg/dL
Protein, ur: NEGATIVE mg/dL
Urobilinogen, UA: 0.2 mg/dL (ref 0.0–1.0)

## 2011-08-01 LAB — URINE MICROSCOPIC-ADD ON

## 2011-08-01 MED ORDER — ONDANSETRON 8 MG PO TBDP: 8.0000 mg | ORAL_TABLET | Freq: Three times a day (TID) | ORAL | Status: AC | PRN

## 2011-08-01 MED ORDER — ONDANSETRON HCL 4 MG PO TABS
8.0000 mg | ORAL_TABLET | Freq: Once | ORAL | Status: AC
  Administered 2011-08-01: 8 mg via ORAL
  Filled 2011-08-01: qty 2

## 2011-08-01 NOTE — ED Provider Notes (Signed)
History     Chief Complaint  Patient presents with  . Abdominal Pain  . Emesis   Abdominal Pain Associated symptoms include nausea and vomiting. Pertinent negatives include no dysuria, fever or frequency.  Emesis  Associated symptoms include abdominal pain. Pertinent negatives include no chills, coughing or fever.    Pt is here with report of nausea and vomiting that started last night.  Denies fever, body aches, or chills.  Vomited 5-6x; no recent food prior to vomiting.  Denies vaginal bleeding or abnormal vaginal discharge.  +abdominal cramping; +chlamydia in October; pt reports treatment and has not had sex since.  Pt declines pelvic exam, but consents to checking urine for GC/CT.  IUP documented on a prior exam on 06/24/11.  Past Medical History  Diagnosis Date  . Anxiety     little bit  . Chlamydia     Past Surgical History  Procedure Date  . Cholecystectomy, laparoscopic     Family History  Problem Relation Age of Onset  . Anesthesia problems Neg Hx   . Hypotension Neg Hx   . Malignant hyperthermia Neg Hx   . Pseudochol deficiency Neg Hx     History  Substance Use Topics  . Smoking status: Former Games developer  . Smokeless tobacco: Never Used   Comment: quit 08/12  . Alcohol Use: No    Allergies: No Known Allergies  Prescriptions prior to admission  Medication Sig Dispense Refill  . acetaminophen (TYLENOL) 500 MG tablet Take 500 mg by mouth every 6 (six) hours as needed. For headache       . diazepam (VALIUM) 2 MG tablet Take 2 mg by mouth every 6 (six) hours as needed. For anxiety         Review of Systems  Constitutional: Negative for fever and chills.  Respiratory: Negative for cough.   Gastrointestinal: Positive for nausea, vomiting and abdominal pain.  Genitourinary: Negative for dysuria and frequency.  All other systems reviewed and are negative.   Physical Exam   Blood pressure 127/60, pulse 76, temperature 98.7 F (37.1 C), temperature source  Oral, resp. rate 20, height 5' 6.25" (1.683 m), weight 105.688 kg (233 lb), last menstrual period 05/11/2011, SpO2 99.00%.  Physical Exam  Constitutional: She is oriented to person, place, and time. She appears well-developed and well-nourished. No distress.  HENT:  Head: Normocephalic.  Mouth/Throat: Mucous membranes are normal. Mucous membranes are not dry.  Neck: Normal range of motion. Neck supple.  Cardiovascular: Normal rate, regular rhythm and normal heart sounds.   Respiratory: Effort normal and breath sounds normal.  GI: Soft. There is no tenderness.       Hyperactive bowel sounds  Genitourinary: No bleeding around the vagina.  Neurological: She is alert and oriented to person, place, and time.  Skin: Skin is warm and dry.    MAU Course  Procedures  UA - negative GC/CT - pend Zofran PO  Assessment and Plan  Gastroenteritis  Plan: DC to home Increase Fluids RX Zofran F/U if no improvement or worsening of symptoms [facility]   [facility] 08/01/2011, 1:26 PM

## 2011-08-01 NOTE — Progress Notes (Signed)
Pt reports there's an emergency and needs to leave.

## 2011-08-01 NOTE — Progress Notes (Signed)
Patient states she started having lower abdominal pain last night. Started vomiting and has had 4-5 episodes since last night. Patient has her first appointment with Dr. Gaynell Face next week. Has had a tooth ache and has been taking a lot of ASA and Tylenol and is concerned about the effects of too much medication.

## 2011-08-01 NOTE — Progress Notes (Signed)
Pt in c/o severe lower abdominal cramps since last night.  Also reports vomiting x4-5 episodes since yesterday.  2 x today.  Denies any bleeding or discharge.  Has been taking tylenol and ASA for toothache x2 weeks, has not helped with cramping.

## 2011-08-02 LAB — GC/CHLAMYDIA PROBE AMP, URINE: Chlamydia, Swab/Urine, PCR: NEGATIVE

## 2011-09-03 NOTE — L&D Delivery Note (Signed)
Delivery Note At 3:06 AM a viable female was delivered via Vaginal, Spontaneous Delivery (Presentation: ; Occiput Anterior).  APGAR: , ; weight 7 lb 15.2 oz (3605 g).   Placenta status: , Manual removal Retained.  Cord:  with the following complications: .  Cord pH: not done  Anesthesia: Epidural  Episiotomy:  Lacerations:  Suture Repair: 2.0 Est. Blood Loss (mL):   Mom to postpartum.  Baby to nursery-stable.  Litzy Dicker A 02/06/2012, 3:46 AM

## 2011-10-04 ENCOUNTER — Inpatient Hospital Stay (HOSPITAL_COMMUNITY)
Admission: AD | Admit: 2011-10-04 | Discharge: 2011-10-05 | Disposition: A | Discharge status: Home / Self Care | Payer: Medicaid Other | Source: Ambulatory Visit | Attending: Obstetrics and Gynecology | Admitting: Obstetrics and Gynecology

## 2011-10-04 ENCOUNTER — Inpatient Hospital Stay (HOSPITAL_COMMUNITY): Payer: Medicaid Other

## 2011-10-04 ENCOUNTER — Encounter (HOSPITAL_COMMUNITY): Payer: Self-pay | Admitting: *Deleted

## 2011-10-04 DIAGNOSIS — IMO0002 Reserved for concepts with insufficient information to code with codable children: Secondary | ICD-10-CM

## 2011-10-04 DIAGNOSIS — R109 Unspecified abdominal pain: Secondary | ICD-10-CM | POA: Insufficient documentation

## 2011-10-04 DIAGNOSIS — B373 Candidiasis of vulva and vagina: Secondary | ICD-10-CM | POA: Insufficient documentation

## 2011-10-04 DIAGNOSIS — O26879 Cervical shortening, unspecified trimester: Secondary | ICD-10-CM

## 2011-10-04 DIAGNOSIS — O26899 Other specified pregnancy related conditions, unspecified trimester: Secondary | ICD-10-CM

## 2011-10-04 DIAGNOSIS — B3731 Acute candidiasis of vulva and vagina: Secondary | ICD-10-CM | POA: Insufficient documentation

## 2011-10-04 DIAGNOSIS — O2390 Unspecified genitourinary tract infection in pregnancy, unspecified trimester: Secondary | ICD-10-CM | POA: Insufficient documentation

## 2011-10-04 LAB — URINALYSIS, ROUTINE W REFLEX MICROSCOPIC
Glucose, UA: NEGATIVE mg/dL
Leukocytes, UA: NEGATIVE
Nitrite: NEGATIVE
Protein, ur: NEGATIVE mg/dL

## 2011-10-04 LAB — WET PREP, GENITAL: Trich, Wet Prep: NONE SEEN

## 2011-10-04 MED ORDER — ACETAMINOPHEN 325 MG PO TABS
650.0000 mg | ORAL_TABLET | Freq: Once | ORAL | Status: AC
  Administered 2011-10-04: 650 mg via ORAL
  Filled 2011-10-04: qty 2

## 2011-10-04 NOTE — Progress Notes (Signed)
Pt states, " I started having contractions last night, and I layed down and finally stopped. I stayed in bed until four-five and they started right back. I ate and threw up also, and I haven't been vomiting with this pregnancy."

## 2011-10-04 NOTE — ED Provider Notes (Signed)
History     Chief Complaint  Patient presents with  . Contractions   HPI Bridget Wilson 26 y.o. 20w 6d by LMP.  No prenatal care.  Has had abdominal pain x 2 days.  Worse today.  Has not taken any medication for pain.  Came for evaluation.  Has an appointment to begin prenatal care on 10-21-11.   OB History    Grav Para Term Preterm Abortions TAB SAB Ect Mult Living   2 1 1  0 0 0 0 0 0 1      Past Medical History  Diagnosis Date  . Anxiety     little bit  . Chlamydia     Past Surgical History  Procedure Date  . Cholecystectomy, laparoscopic     Family History  Problem Relation Age of Onset  . Anesthesia problems Neg Hx   . Hypotension Neg Hx   . Malignant hyperthermia Neg Hx   . Pseudochol deficiency Neg Hx     History  Substance Use Topics  . Smoking status: Former Games developer  . Smokeless tobacco: Never Used   Comment: quit 08/12  . Alcohol Use: No    Allergies: No Known Allergies  No prescriptions prior to admission    Review of Systems  Constitutional: Negative for fever.  Gastrointestinal: Positive for vomiting and abdominal pain. Negative for nausea and diarrhea.  Genitourinary: Negative for dysuria.       No vaginal bleeding or leaking   Physical Exam   Blood pressure 116/52, pulse 89, temperature 98.7 F (37.1 C), temperature source Oral, resp. rate 20, height 5\' 6"  (1.676 m), weight 236 lb 6 oz (107.219 kg), last menstrual period 05/11/2011.  Physical Exam  Nursing note and vitals reviewed. Constitutional: She is oriented to person, place, and time. She appears well-developed and well-nourished.       obese  HENT:  Head: Normocephalic.  Eyes: EOM are normal.  Neck: Neck supple.  GI: Soft. There is tenderness. There is no rebound and no guarding.  Genitourinary:       Speculum exam: Vagina - Small amount of creamy discharge, no odor Cervix - No contact bleeding Bimanual exam: Cervix internal os closed Uterus 20 week size - fundus to  umbilicus Adnexa non tender, no masses bilaterally GC/Chlam, wet prep done Chaperone present for exam.  Musculoskeletal: Normal range of motion.  Neurological: She is alert and oriented to person, place, and time.  Skin: Skin is warm and dry.  Psychiatric: She has a normal mood and affect.    MAU Course  Procedures Results for orders placed during the hospital encounter of 10/04/11 (from the past 24 hour(s))  URINALYSIS, ROUTINE W REFLEX MICROSCOPIC     Status: Normal   Collection Time   10/04/11  8:49 PM      Component Value Range   Color, Urine YELLOW  YELLOW    APPearance CLEAR  CLEAR    Specific Gravity, Urine 1.015  1.005 - 1.030    pH 6.5  5.0 - 8.0    Glucose, UA NEGATIVE  NEGATIVE (mg/dL)   Hgb urine dipstick NEGATIVE  NEGATIVE    Bilirubin Urine NEGATIVE  NEGATIVE    Ketones, ur NEGATIVE  NEGATIVE (mg/dL)   Protein, ur NEGATIVE  NEGATIVE (mg/dL)   Urobilinogen, UA 0.2  0.0 - 1.0 (mg/dL)   Nitrite NEGATIVE  NEGATIVE    Leukocytes, UA NEGATIVE  NEGATIVE   WET PREP, GENITAL     Status: Abnormal   Collection Time  10/04/11 10:20 PM      Component Value Range   Yeast Wet Prep HPF POC MODERATE (*) NONE SEEN    Trich, Wet Prep NONE SEEN  NONE SEEN    Clue Cells Wet Prep HPF POC NONE SEEN  NONE SEEN    WBC, Wet Prep HPF POC MODERATE (*) NONE SEEN     MDM Complains of constant lower abdominal tenderness - worse than yesterday when the pain was periodic.  Will get ultrasound as client has not yet started prenatal care. Ultrasound - 21w 2d  Marginal cord insertion and cervical length 2.68 0042 Consult with Dr. Emelda Fear and discussed plan of care   Assessment and Plan  21w gestation with marginal cord insertion and cervical length of 2.68 Abdominal pain in pregnancy Yeast infection  Plan Drink at least 8 8-oz glasses of water every day. Return if abdominal pain worsens Discussed with client importance of returning if pain is worse Call Dr. Gaynell Face and see if  appointment can be sooner to begin prenatal care. rx terazol vaginal cream 1 applicator in vagina at hs for yeast infection for 7 days   Arturo Sofranko 10/04/2011, 10:29 PM   Nolene Bernheim, NP 10/05/11 2130

## 2011-10-04 NOTE — Progress Notes (Signed)
PT SAYS SHE STARTED HURTING AT 4 PM, BUT ON Thursday- STARTED AT 830PM- UNTIL 2300-  FEELS PAIN IN LOWER ABD-- CONSTANT-  FEELS LIKE BABY IS LOW.

## 2011-10-04 NOTE — Progress Notes (Signed)
HAS NO PNC- HAS AN APPOINTMENT WITH DR MARSHALL ON 10-21-2011- WENT TO DR MARSHALL WITH LAST PREG.

## 2011-10-05 ENCOUNTER — Encounter (HOSPITAL_COMMUNITY): Payer: Self-pay | Admitting: *Deleted

## 2011-10-05 LAB — GC/CHLAMYDIA PROBE AMP, GENITAL: GC Probe Amp, Genital: NEGATIVE

## 2011-10-05 MED ORDER — TERCONAZOLE 0.4 % VA CREA: 1.0000 | TOPICAL_CREAM | Freq: Every day | VAGINAL | Status: AC

## 2011-10-05 NOTE — ED Notes (Signed)
Ok per Lilyan Punt NP not to reapply efm

## 2011-10-05 NOTE — Progress Notes (Signed)
T. Burleson NP in earlier to discuss u/s results and d/c plan. Written and verbal d/c instructions given and understanding voiced.  

## 2011-10-08 NOTE — ED Provider Notes (Signed)
Attestation of Attending Supervision of Advanced Practitioner: Evaluation and management procedures were performed by the PA/NP/CNM/OB Fellow under my supervision/collaboration. Chart reviewed and agree with management and plan.  Tilda Burrow 10/08/2011 6:51 PM

## 2011-11-06 ENCOUNTER — Other Ambulatory Visit (HOSPITAL_COMMUNITY): Payer: Self-pay | Admitting: Obstetrics

## 2011-11-06 DIAGNOSIS — Z3689 Encounter for other specified antenatal screening: Secondary | ICD-10-CM

## 2011-11-08 ENCOUNTER — Ambulatory Visit (HOSPITAL_COMMUNITY)
Admission: RE | Admit: 2011-11-08 | Discharge: 2011-11-08 | Disposition: A | Discharge status: Home / Self Care | Payer: Medicaid Other | Source: Ambulatory Visit | Attending: Obstetrics | Admitting: Obstetrics

## 2011-11-08 DIAGNOSIS — Z3689 Encounter for other specified antenatal screening: Secondary | ICD-10-CM

## 2011-11-22 LAB — OB RESULTS CONSOLE ABO/RH: RH Type: POSITIVE

## 2012-01-16 LAB — OB RESULTS CONSOLE GBS: GBS: NEGATIVE

## 2012-01-19 ENCOUNTER — Encounter (HOSPITAL_COMMUNITY): Payer: Self-pay | Admitting: *Deleted

## 2012-01-19 ENCOUNTER — Inpatient Hospital Stay (HOSPITAL_COMMUNITY)
Admission: AD | Admit: 2012-01-19 | Discharge: 2012-01-19 | Disposition: A | Discharge status: Home / Self Care | Payer: Medicaid Other | Source: Ambulatory Visit | Attending: Obstetrics | Admitting: Obstetrics

## 2012-01-19 DIAGNOSIS — O479 False labor, unspecified: Secondary | ICD-10-CM | POA: Insufficient documentation

## 2012-01-19 MED ORDER — OXYCODONE-ACETAMINOPHEN 5-325 MG PO TABS
2.0000 | ORAL_TABLET | Freq: Once | ORAL | Status: AC
  Administered 2012-01-19: 2 via ORAL
  Filled 2012-01-19: qty 2

## 2012-01-19 MED ORDER — NIFEDIPINE 10 MG PO CAPS
10.0000 mg | ORAL_CAPSULE | Freq: Once | ORAL | Status: AC
  Administered 2012-01-19: 10 mg via ORAL
  Filled 2012-01-19: qty 1

## 2012-01-19 MED ORDER — LACTATED RINGERS IV BOLUS (SEPSIS)
1000.0000 mL | Freq: Once | INTRAVENOUS | Status: AC
  Administered 2012-01-19: 1000 mL via INTRAVENOUS

## 2012-01-19 NOTE — Progress Notes (Signed)
Notified no change in SVE after 1 hr. Pt requesting something for pain. Ctx irregular 5-8 min. Dr. Clearance Coots order IV hydration percocet and procardia

## 2012-01-19 NOTE — MAU Note (Signed)
Pt reporrts having contractions since 5am q 5-7 min. Denies SROM or bleeding at this time. Good fetal movement reproted

## 2012-01-19 NOTE — Progress Notes (Signed)
Notified MD pt feeing better. Ctx have stopped and pt requesting to go home. Ok to d/c pt home.

## 2012-01-27 ENCOUNTER — Inpatient Hospital Stay (HOSPITAL_COMMUNITY)
Admission: AD | Admit: 2012-01-27 | Discharge: 2012-01-27 | Disposition: A | Discharge status: Home / Self Care | Payer: Medicaid Other | Source: Ambulatory Visit | Attending: Obstetrics | Admitting: Obstetrics

## 2012-01-27 DIAGNOSIS — O479 False labor, unspecified: Secondary | ICD-10-CM | POA: Insufficient documentation

## 2012-01-27 MED ORDER — ZOLPIDEM TARTRATE 10 MG PO TABS
10.0000 mg | ORAL_TABLET | Freq: Once | ORAL | Status: AC
  Administered 2012-01-27: 10 mg via ORAL
  Filled 2012-01-27: qty 1

## 2012-01-27 NOTE — MAU Note (Signed)
Dr. Tamela Oddi notified of pt arrival via EMS for labor check.  Notified FHR reactive, no UC's seen on the monitor.  Also notified pt is requesting pain meds& MD declines to give something for pain. Per MD, pt to be d/c'd home.  If pt wants, she may have an Ambien to take before going home.

## 2012-01-27 NOTE — MAU Note (Signed)
Pt presents via EMS with complaint of contractions x 3 hours, denies bleeding or ROM

## 2012-02-05 ENCOUNTER — Inpatient Hospital Stay (HOSPITAL_COMMUNITY): Payer: Medicaid Other | Admitting: Anesthesiology

## 2012-02-05 ENCOUNTER — Encounter (HOSPITAL_COMMUNITY): Payer: Self-pay | Admitting: *Deleted

## 2012-02-05 ENCOUNTER — Inpatient Hospital Stay (HOSPITAL_COMMUNITY)
Admission: AD | Admit: 2012-02-05 | Discharge: 2012-02-08 | DRG: 774 | Disposition: A | Discharge status: Home / Self Care | Payer: Medicaid Other | Source: Ambulatory Visit | Attending: Obstetrics | Admitting: Obstetrics

## 2012-02-05 ENCOUNTER — Encounter (HOSPITAL_COMMUNITY): Payer: Self-pay | Admitting: Anesthesiology

## 2012-02-05 DIAGNOSIS — O26859 Spotting complicating pregnancy, unspecified trimester: Secondary | ICD-10-CM

## 2012-02-05 LAB — CBC
MCHC: 33.2 g/dL (ref 30.0–36.0)
Platelets: 232 10*3/uL (ref 150–400)
RDW: 13.8 % (ref 11.5–15.5)

## 2012-02-05 MED ORDER — LIDOCAINE HCL (PF) 1 % IJ SOLN
30.0000 mL | INTRAMUSCULAR | Status: DC | PRN
  Filled 2012-02-05: qty 30

## 2012-02-05 MED ORDER — TERBUTALINE SULFATE 1 MG/ML IJ SOLN: 0.2500 mg | Freq: Once | INTRAMUSCULAR | Status: AC | PRN

## 2012-02-05 MED ORDER — FENTANYL 2.5 MCG/ML BUPIVACAINE 1/10 % EPIDURAL INFUSION (WH - ANES)
INTRAMUSCULAR | Status: DC | PRN
  Administered 2012-02-05: 14 mL/h via EPIDURAL

## 2012-02-05 MED ORDER — OXYTOCIN 20 UNITS IN LACTATED RINGERS INFUSION - SIMPLE
1.0000 m[IU]/min | INTRAVENOUS | Status: DC
  Administered 2012-02-05: 1 m[IU]/min via INTRAVENOUS

## 2012-02-05 MED ORDER — OXYTOCIN 20 UNITS IN LACTATED RINGERS INFUSION - SIMPLE: 125.0000 mL/h | Freq: Once | INTRAVENOUS | Status: DC

## 2012-02-05 MED ORDER — OXYTOCIN BOLUS FROM INFUSION
500.0000 mL | Freq: Once | INTRAVENOUS | Status: AC
  Administered 2012-02-06: 500 mL via INTRAVENOUS
  Filled 2012-02-05: qty 500
  Filled 2012-02-05: qty 1000

## 2012-02-05 MED ORDER — LACTATED RINGERS IV SOLN
500.0000 mL | Freq: Once | INTRAVENOUS | Status: AC
  Administered 2012-02-05: 500 mL via INTRAVENOUS

## 2012-02-05 MED ORDER — LACTATED RINGERS IV SOLN: 500.0000 mL | INTRAVENOUS | Status: DC | PRN

## 2012-02-05 MED ORDER — FLEET ENEMA 7-19 GM/118ML RE ENEM: 1.0000 | ENEMA | RECTAL | Status: DC | PRN

## 2012-02-05 MED ORDER — BUTORPHANOL TARTRATE 2 MG/ML IJ SOLN
1.0000 mg | INTRAMUSCULAR | Status: DC | PRN
  Administered 2012-02-05 – 2012-02-06 (×2): 1 mg via INTRAVENOUS
  Filled 2012-02-05 (×2): qty 1

## 2012-02-05 MED ORDER — EPHEDRINE 5 MG/ML INJ: 10.0000 mg | INTRAVENOUS | Status: DC | PRN

## 2012-02-05 MED ORDER — IBUPROFEN 600 MG PO TABS
600.0000 mg | ORAL_TABLET | Freq: Four times a day (QID) | ORAL | Status: DC | PRN
  Administered 2012-02-06: 600 mg via ORAL
  Filled 2012-02-05 (×8): qty 1

## 2012-02-05 MED ORDER — PHENYLEPHRINE 40 MCG/ML (10ML) SYRINGE FOR IV PUSH (FOR BLOOD PRESSURE SUPPORT)
80.0000 ug | PREFILLED_SYRINGE | INTRAVENOUS | Status: DC | PRN
  Filled 2012-02-05: qty 5

## 2012-02-05 MED ORDER — PHENYLEPHRINE 40 MCG/ML (10ML) SYRINGE FOR IV PUSH (FOR BLOOD PRESSURE SUPPORT): 80.0000 ug | PREFILLED_SYRINGE | INTRAVENOUS | Status: DC | PRN

## 2012-02-05 MED ORDER — DIPHENHYDRAMINE HCL 50 MG/ML IJ SOLN
12.5000 mg | INTRAMUSCULAR | Status: AC | PRN
  Administered 2012-02-05 (×3): 12.5 mg via INTRAVENOUS
  Filled 2012-02-05: qty 1

## 2012-02-05 MED ORDER — OXYCODONE-ACETAMINOPHEN 5-325 MG PO TABS
1.0000 | ORAL_TABLET | ORAL | Status: DC | PRN
  Administered 2012-02-06 – 2012-02-07 (×9): 1 via ORAL
  Administered 2012-02-08 (×2): 2 via ORAL
  Filled 2012-02-05: qty 1
  Filled 2012-02-05 (×3): qty 2
  Filled 2012-02-05 (×4): qty 1
  Filled 2012-02-05: qty 2

## 2012-02-05 MED ORDER — CITRIC ACID-SODIUM CITRATE 334-500 MG/5ML PO SOLN: 30.0000 mL | ORAL | Status: DC | PRN

## 2012-02-05 MED ORDER — LACTATED RINGERS IV SOLN
INTRAVENOUS | Status: DC
  Administered 2012-02-05 – 2012-02-06 (×2): via INTRAVENOUS

## 2012-02-05 MED ORDER — ACETAMINOPHEN 325 MG PO TABS: 650.0000 mg | ORAL_TABLET | ORAL | Status: DC | PRN

## 2012-02-05 MED ORDER — LIDOCAINE HCL (PF) 1 % IJ SOLN
INTRAMUSCULAR | Status: DC | PRN
  Administered 2012-02-05 (×2): 5 mL

## 2012-02-05 MED ORDER — ONDANSETRON HCL 4 MG/2ML IJ SOLN: 4.0000 mg | Freq: Four times a day (QID) | INTRAMUSCULAR | Status: DC | PRN

## 2012-02-05 MED ORDER — FENTANYL 2.5 MCG/ML BUPIVACAINE 1/10 % EPIDURAL INFUSION (WH - ANES)
14.0000 mL/h | INTRAMUSCULAR | Status: DC
  Administered 2012-02-06: 14 mL/h via EPIDURAL
  Filled 2012-02-05 (×3): qty 60

## 2012-02-05 MED ORDER — EPHEDRINE 5 MG/ML INJ
10.0000 mg | INTRAVENOUS | Status: DC | PRN
  Filled 2012-02-05: qty 4

## 2012-02-05 NOTE — Anesthesia Preprocedure Evaluation (Signed)
Anesthesia Evaluation  Patient identified by MRN, date of birth, ID band Patient awake    Reviewed: Allergy & Precautions, H&P , Patient's Chart, lab work & pertinent test results  Airway Mallampati: II TM Distance: >3 FB Neck ROM: full    Dental No notable dental hx.    Pulmonary neg pulmonary ROS,  breath sounds clear to auscultation  Pulmonary exam normal       Cardiovascular negative cardio ROS  Rhythm:regular Rate:Normal     Neuro/Psych negative neurological ROS  negative psych ROS   GI/Hepatic negative GI ROS, Neg liver ROS,   Endo/Other  negative endocrine ROSMorbid obesity  Renal/GU negative Renal ROS     Musculoskeletal   Abdominal   Peds  Hematology negative hematology ROS (+)   Anesthesia Other Findings   Reproductive/Obstetrics (+) Pregnancy                           Anesthesia Physical Anesthesia Plan  ASA: III  Anesthesia Plan: Epidural   Post-op Pain Management:    Induction:   Airway Management Planned:   Additional Equipment:   Intra-op Plan:   Post-operative Plan:   Informed Consent: I have reviewed the patients History and Physical, chart, labs and discussed the procedure including the risks, benefits and alternatives for the proposed anesthesia with the patient or authorized representative who has indicated his/her understanding and acceptance.     Plan Discussed with:   Anesthesia Plan Comments:         Anesthesia Quick Evaluation  

## 2012-02-05 NOTE — MAU Note (Signed)
Pt in c/o contractions since earlier today, states it has been off and on.  Reports lower back pain.  Reports loose stools since yesterday.  Reports vomiting yesterday, none today.  Denies any bleeding or leaking of fluid.  + FM.

## 2012-02-05 NOTE — Anesthesia Procedure Notes (Signed)
Epidural Patient location during procedure: OB Start time: 02/05/2012 8:15 PM  Staffing Anesthesiologist: Brayton Caves R Performed by: anesthesiologist   Preanesthetic Checklist Completed: patient identified, site marked, surgical consent, pre-op evaluation, timeout performed, IV checked, risks and benefits discussed and monitors and equipment checked  Epidural Patient position: sitting Prep: site prepped and draped and DuraPrep Patient monitoring: continuous pulse ox and blood pressure Approach: midline Injection technique: LOR air  Needle:  Needle type: Tuohy  Needle gauge: 17 G Needle length: 9 cm Needle insertion depth: 8 cm Catheter type: closed end flexible Catheter size: 19 Gauge Catheter at skin depth: 14 cm Test dose: negative  Assessment Events: blood not aspirated, injection not painful, no injection resistance, negative IV test and no paresthesia  Additional Notes Patient identified.  Risk benefits discussed including failed block, incomplete pain control, headache, nerve damage, paralysis, blood pressure changes, nausea, vomiting, reactions to medication both toxic or allergic, and postpartum back pain.  Patient expressed understanding and wished to proceed.  All questions were answered.  Sterile technique used throughout procedure and epidural site dressed with sterile barrier dressing. No paresthesia or other complications noted.The patient did not experience any signs of intravascular injection such as tinnitus or metallic taste in mouth nor signs of intrathecal spread such as rapid motor block. Please see nursing notes for vital signs.

## 2012-02-05 NOTE — MAU Provider Note (Signed)
  History     CSN: [id number]  Arrival date and time: 02/05/12 1606   None     Chief Complaint  Patient presents with  . Diarrhea  . Contractions   HPI  Pt is [redacted] weeks pregnant and presents with nausea and vomiting yesterday and had increase in back pain today.  Pt had contractions this morning.  She spotting 3 hours ago, but no leakage of fluid.  Pt denies pain with urination.  She has had diarrhea that started yesterday and continues today.  She ate breakfast this morning and stayed down today.  But she has had diarrhea after eating.  She denies fever or chills.  She denies problems with her pregnancy.  She last saw Dr. Gaynell Face 2 weeks ago.    Past Medical History  Diagnosis Date  . Anxiety     little bit  . Chlamydia     Past Surgical History  Procedure Date  . Cholecystectomy, laparoscopic     Family History  Problem Relation Age of Onset  . Anesthesia problems Neg Hx   . Hypotension Neg Hx   . Malignant hyperthermia Neg Hx   . Pseudochol deficiency Neg Hx     History  Substance Use Topics  . Smoking status: Former Games developer  . Smokeless tobacco: Never Used   Comment: quit 08/12  . Alcohol Use: No    Allergies: No Known Allergies  Prescriptions prior to admission  Medication Sig Dispense Refill  . acetaminophen (TYLENOL) 325 MG suppository Place 325 mg rectally every 4 (four) hours as needed. Stomach pain      . Prenatal Vit-Fe Fumarate-FA (PRENATAL MULTIVITAMIN) TABS Take 1 tablet by mouth daily.        ROS Physical Exam   Blood pressure 136/87, pulse 67, temperature 97 F (36.1 C), temperature source Oral, resp. rate 18, height 5\' 7"  (1.702 m), weight 265 lb (120.203 kg), last menstrual period 05/11/2011.  Physical Exam  MAU Course  Procedures  Pt admitted for labor- cervical exam- 5 cm  Assessment and Plan    Bridget Wilson 02/05/2012, 4:52 PM

## 2012-02-06 ENCOUNTER — Encounter (HOSPITAL_COMMUNITY): Payer: Self-pay | Admitting: *Deleted

## 2012-02-06 LAB — RPR: RPR Ser Ql: NONREACTIVE

## 2012-02-06 MED ORDER — FERROUS SULFATE 325 (65 FE) MG PO TABS
325.0000 mg | ORAL_TABLET | Freq: Two times a day (BID) | ORAL | Status: DC
  Administered 2012-02-06 – 2012-02-08 (×5): 325 mg via ORAL
  Filled 2012-02-06 (×5): qty 1

## 2012-02-06 MED ORDER — TETANUS-DIPHTH-ACELL PERTUSSIS 5-2.5-18.5 LF-MCG/0.5 IM SUSP
0.5000 mL | Freq: Once | INTRAMUSCULAR | Status: AC
  Administered 2012-02-07: 0.5 mL via INTRAMUSCULAR
  Filled 2012-02-06: qty 0.5

## 2012-02-06 MED ORDER — ZOLPIDEM TARTRATE 5 MG PO TABS: 5.0000 mg | ORAL_TABLET | Freq: Every evening | ORAL | Status: DC | PRN

## 2012-02-06 MED ORDER — OXYCODONE-ACETAMINOPHEN 5-325 MG PO TABS
1.0000 | ORAL_TABLET | ORAL | Status: DC | PRN
  Administered 2012-02-06 (×2): 1 via ORAL
  Filled 2012-02-06 (×4): qty 1

## 2012-02-06 MED ORDER — ONDANSETRON HCL 4 MG PO TABS: 4.0000 mg | ORAL_TABLET | ORAL | Status: DC | PRN

## 2012-02-06 MED ORDER — SIMETHICONE 80 MG PO CHEW: 80.0000 mg | CHEWABLE_TABLET | ORAL | Status: DC | PRN

## 2012-02-06 MED ORDER — SODIUM BICARBONATE 8.4 % IV SOLN
INTRAVENOUS | Status: DC | PRN
  Administered 2012-02-06 (×2): 5 mL via EPIDURAL

## 2012-02-06 MED ORDER — IBUPROFEN 600 MG PO TABS
600.0000 mg | ORAL_TABLET | Freq: Four times a day (QID) | ORAL | Status: DC
  Administered 2012-02-06 – 2012-02-08 (×8): 600 mg via ORAL
  Filled 2012-02-06: qty 1

## 2012-02-06 MED ORDER — WITCH HAZEL-GLYCERIN EX PADS: 1.0000 "application " | MEDICATED_PAD | CUTANEOUS | Status: DC | PRN

## 2012-02-06 MED ORDER — PRENATAL MULTIVITAMIN CH
1.0000 | ORAL_TABLET | Freq: Every day | ORAL | Status: DC
  Administered 2012-02-06 – 2012-02-07 (×2): 1 via ORAL
  Filled 2012-02-06 (×3): qty 1

## 2012-02-06 MED ORDER — SENNOSIDES-DOCUSATE SODIUM 8.6-50 MG PO TABS: 2.0000 | ORAL_TABLET | Freq: Every day | ORAL | Status: DC

## 2012-02-06 MED ORDER — BENZOCAINE-MENTHOL 20-0.5 % EX AERO
1.0000 "application " | INHALATION_SPRAY | CUTANEOUS | Status: DC | PRN
  Administered 2012-02-06: 1 via TOPICAL
  Filled 2012-02-06: qty 56

## 2012-02-06 MED ORDER — LANOLIN HYDROUS EX OINT: TOPICAL_OINTMENT | CUTANEOUS | Status: DC | PRN

## 2012-02-06 MED ORDER — ONDANSETRON HCL 4 MG/2ML IJ SOLN: 4.0000 mg | INTRAMUSCULAR | Status: DC | PRN

## 2012-02-06 MED ORDER — DIPHENHYDRAMINE HCL 25 MG PO CAPS: 25.0000 mg | ORAL_CAPSULE | Freq: Four times a day (QID) | ORAL | Status: DC | PRN

## 2012-02-06 MED ORDER — DIBUCAINE 1 % RE OINT: 1.0000 "application " | TOPICAL_OINTMENT | RECTAL | Status: DC | PRN

## 2012-02-06 NOTE — Addendum Note (Signed)
Addendum  created 02/06/12 1025 by Jalon Squier C Yarielis Funaro, CRNA   Modules edited:Charges VN, Notes Section    

## 2012-02-06 NOTE — Progress Notes (Signed)
UR chart review completed.  

## 2012-02-06 NOTE — Addendum Note (Signed)
Addendum  created 02/06/12 1025 by Suella Grove, CRNA   Modules edited:Charges VN, Notes Section

## 2012-02-06 NOTE — Progress Notes (Signed)
MD notified about pt's pain level and intervention. Wants pt to get another dose of EPCA and call back with results

## 2012-02-06 NOTE — H&P (Signed)
This is Dr. Francoise Ceo dictating the history and physical on  Bridget Wilson she's a 26 year old gravida 2 para 101 at 45 weeks and 1 day her [facility] 02/12/2012 GBS is negative and she was admitted 5 cm in labor amniotomy was performed and the fluids clear and she is now 7 cm 100% with the vertex at a -1-2 station Past medical history negative Past social history negative Past surgical history negative System review noncontributory Physical exam so well-developed female in labor HEENT negative Lungs clear to P&A Heart regular rhythm no murmurs no gallops Breasts engorged Abdomen term estimated fetal weight 8 pounds Pelvic as described above Extremities negative

## 2012-02-06 NOTE — Progress Notes (Signed)
Dr. Gaynell Face wants Dr. Sheral Apley at bedside due to possible retained placenta

## 2012-02-06 NOTE — Anesthesia Postprocedure Evaluation (Signed)
  Anesthesia Post-op Note  Patient: Bridget Wilson  Procedure(s) Performed: * No procedures listed *  Patient Location: Mother/Baby  Anesthesia Type: Epidural  Level of Consciousness: awake  Airway and Oxygen Therapy: Patient Spontanous Breathing  Post-op Pain: none  Post-op Assessment: Patient's Cardiovascular Status Stable, Respiratory Function Stable, Patent Airway, No signs of Nausea or vomiting, Adequate PO intake, Pain level controlled, No headache, No backache, No residual numbness and No residual motor weakness  Post-op Vital Signs: Reviewed and stable  Complications: No apparent anesthesia complications

## 2012-02-07 LAB — CBC
HCT: 29.2 % — ABNORMAL LOW (ref 36.0–46.0)
MCHC: 33.2 g/dL (ref 30.0–36.0)
Platelets: 194 10*3/uL (ref 150–400)
RDW: 14 % (ref 11.5–15.5)
WBC: 9 10*3/uL (ref 4.0–10.5)

## 2012-02-07 NOTE — Progress Notes (Signed)
Patient ID: Bridget Wilson, female   DOB: 09-11-1985, 26 y.o.   MRN: [id number] Postpartum day one Vital signs normal Fundus firm Legs negative No complaints doing well

## 2012-02-08 NOTE — Discharge Summary (Signed)
Obstetric Discharge Summary Reason for Admission: onset of labor Prenatal Procedures: none Intrapartum Procedures: spontaneous vaginal delivery Postpartum Procedures: none Complications-Operative and Postpartum: none Hemoglobin  Date Value Range Status  02/07/2012 9.7* 12.0-15.0 (g/dL) Final     HCT  Date Value Range Status  02/07/2012 29.2* 36.0-46.0 (%) Final    Physical Exam:  General: alert Lochia: appropriate Uterine Fundus: firm Incision: healing well DVT Evaluation: No evidence of DVT seen on physical exam.  Discharge Diagnoses: Term Pregnancy-delivered  Discharge Information: Date: 02/08/2012 Activity: pelvic rest Diet: routine Medications: Percocet Condition: stable Instructions: refer to practice specific booklet Discharge to: home Follow-up Information    Follow up with Nakaila Freeze A, MD. Call in 6 weeks.   Contact information:   [address] [place] [address] [phone]          Newborn Data: Live born female  Birth Weight: 7 lb 15.2 oz (3605 g) APGAR: 8, 9  Home with mother.  Dashanae Longfield A 02/08/2012, 8:07 AM

## 2012-02-08 NOTE — Discharge Instructions (Signed)
Discharge instructions   You can wash your hair  Shower  Eat what you want  Drink what you want  See me in 6 weeks  Your ankles are going to swell more in the next 2 weeks than when pregnant  No sex for 6 weeks   Jamice Carreno A, MD 02/08/2012

## 2012-02-12 ENCOUNTER — Encounter (HOSPITAL_COMMUNITY): Payer: Self-pay | Admitting: *Deleted

## 2012-04-07 ENCOUNTER — Encounter (HOSPITAL_COMMUNITY): Payer: Self-pay | Admitting: Emergency Medicine

## 2012-04-07 ENCOUNTER — Emergency Department (HOSPITAL_COMMUNITY)
Admission: EM | Admit: 2012-04-07 | Discharge: 2012-04-07 | Disposition: A | Discharge status: Home / Self Care | Payer: Medicaid Other | Attending: Emergency Medicine | Admitting: Emergency Medicine

## 2012-04-07 DIAGNOSIS — IMO0002 Reserved for concepts with insufficient information to code with codable children: Secondary | ICD-10-CM | POA: Insufficient documentation

## 2012-04-07 DIAGNOSIS — F17200 Nicotine dependence, unspecified, uncomplicated: Secondary | ICD-10-CM | POA: Insufficient documentation

## 2012-04-07 DIAGNOSIS — L02419 Cutaneous abscess of limb, unspecified: Secondary | ICD-10-CM

## 2012-04-07 MED ORDER — HYDROCODONE-ACETAMINOPHEN 5-500 MG PO TABS: 1.0000 | ORAL_TABLET | Freq: Four times a day (QID) | ORAL | Status: AC | PRN

## 2012-04-07 MED ORDER — CLINDAMYCIN HCL 150 MG PO CAPS: 300.0000 mg | ORAL_CAPSULE | Freq: Three times a day (TID) | ORAL | Status: AC

## 2012-04-07 NOTE — ED Notes (Signed)
Pt reports swelling under r/axilla. Area tender and swollen

## 2012-04-07 NOTE — ED Provider Notes (Signed)
History     CSN: [id number]  Arrival date & time 04/07/12  1047   First MD Initiated Contact with Patient 04/07/12 1156      Chief Complaint  Patient presents with  . Abscess    r/axillary absess, area swollen an tender    (Consider location/radiation/quality/duration/timing/severity/associated sxs/prior treatment) Patient is a 26 y.o. female presenting with abscess. The history is provided by the patient.  Abscess  This is a new problem. The current episode started less than one week ago. The problem has been gradually worsening. Affected Location: right axilla. The problem is moderate. The abscess is characterized by redness, painfulness and swelling. Pertinent negatives include no fever.  Pt with large abscess to right axilla. Hx of the same. States they have never been this large. Denies fever, chills, malaise. No treatments tried prior to arrival.  Past Medical History  Diagnosis Date  . Anxiety     little bit  . Chlamydia     Past Surgical History  Procedure Date  . Cholecystectomy, laparoscopic     Family History  Problem Relation Age of Onset  . Anesthesia problems Neg Hx   . Hypotension Neg Hx   . Malignant hyperthermia Neg Hx   . Pseudochol deficiency Neg Hx   . Hypertension Mother   . Diabetes Mother     History  Substance Use Topics  . Smoking status: Current Everyday Smoker    Types: Cigarettes  . Smokeless tobacco: Never Used   Comment: quit 08/12  . Alcohol Use: Yes    OB History    Grav Para Term Preterm Abortions TAB SAB Ect Mult Living   2 2 2  0 0 0 0 0 0 2      Review of Systems  Constitutional: Negative for fever and chills.  Respiratory: Negative.   Cardiovascular: Negative.   Musculoskeletal: Negative.   Skin: Positive for wound.  Neurological: Negative for dizziness, weakness, numbness and headaches.    Allergies  Review of patient's allergies indicates no known allergies.  Home Medications   Current Outpatient Rx  Name  Route Sig Dispense Refill  . ACETAMINOPHEN 500 MG PO TABS Oral Take 1,000 mg by mouth every 6 (six) hours as needed. For pain      BP 107/71  Pulse 78  Temp 98 F (36.7 C) (Oral)  Resp 18  SpO2 100%  LMP 03/07/2012  Breastfeeding? No  Physical Exam  Nursing note and vitals reviewed. Constitutional: She appears well-developed. No distress.  Cardiovascular: Normal rate, regular rhythm and normal heart sounds.   Pulmonary/Chest: Effort normal and breath sounds normal. No respiratory distress. She has no wheezes. She has no rales.  Musculoskeletal:       See skin exam  Skin: Skin is warm and dry.       Large 6x6cm abscess to the right axilla. Area is tender, swollen. No drainage. No surrounding cellulitis  Psychiatric: She has a normal mood and affect.    ED Course  Procedures (including critical care time)  INCISION AND DRAINAGE Performed by: Jaynie Crumble A Consent: Verbal consent obtained. Risks and benefits: risks, benefits and alternatives were discussed Type: abscess  Body area: right axilla Anesthesia: local infiltration  Local anesthetic: lidocaine 2 w/pinephrine  Anesthetic total: 6ml  Complexity: complex Blunt dissection to break up loculations  Drainage: purulent  Drainage amount: large Packing material: 1/4 in iodoform gauze  Patient tolerance: Patient tolerated the procedure well with no immediate complications.    Will start on pain  meds, antibiotic. Return in 2 days for recheck. Pt otherwise afebrile, non toxic.    1. Abscess of axilla       MDM          Lottie Mussel, PA 04/07/12 1611

## 2012-04-09 NOTE — ED Provider Notes (Signed)
Medical screening examination/treatment/procedure(s) were performed by non-physician practitioner and as supervising physician I was immediately available for consultation/collaboration.  Shauna Bodkins T Keene Gilkey, MD 04/09/12 0820 

## 2012-07-14 ENCOUNTER — Encounter (HOSPITAL_COMMUNITY): Payer: Self-pay | Admitting: *Deleted

## 2012-07-14 ENCOUNTER — Emergency Department (HOSPITAL_COMMUNITY)
Admission: EM | Admit: 2012-07-14 | Discharge: 2012-07-14 | Disposition: A | Discharge status: Home / Self Care | Payer: Medicaid Other | Attending: Emergency Medicine | Admitting: Emergency Medicine

## 2012-07-14 DIAGNOSIS — L0291 Cutaneous abscess, unspecified: Secondary | ICD-10-CM

## 2012-07-14 DIAGNOSIS — IMO0002 Reserved for concepts with insufficient information to code with codable children: Secondary | ICD-10-CM | POA: Insufficient documentation

## 2012-07-14 DIAGNOSIS — F17200 Nicotine dependence, unspecified, uncomplicated: Secondary | ICD-10-CM | POA: Insufficient documentation

## 2012-07-14 DIAGNOSIS — R21 Rash and other nonspecific skin eruption: Secondary | ICD-10-CM | POA: Insufficient documentation

## 2012-07-14 DIAGNOSIS — F411 Generalized anxiety disorder: Secondary | ICD-10-CM | POA: Insufficient documentation

## 2012-07-14 MED ORDER — HYDROMORPHONE HCL PF 1 MG/ML IJ SOLN
1.0000 mg | Freq: Once | INTRAMUSCULAR | Status: AC
  Administered 2012-07-14: 1 mg via INTRAVENOUS
  Filled 2012-07-14: qty 1

## 2012-07-14 MED ORDER — LIDOCAINE-EPINEPHRINE (PF) 1 %-1:200000 IJ SOLN
30.0000 mL | Freq: Once | INTRAMUSCULAR | Status: AC
  Administered 2012-07-14: 30 mL
  Filled 2012-07-14: qty 10

## 2012-07-14 MED ORDER — ONDANSETRON 4 MG PO TBDP
4.0000 mg | ORAL_TABLET | Freq: Once | ORAL | Status: AC
  Administered 2012-07-14: 4 mg via ORAL
  Filled 2012-07-14: qty 1

## 2012-07-14 MED ORDER — LORAZEPAM 2 MG/ML IJ SOLN
2.0000 mg | Freq: Once | INTRAMUSCULAR | Status: AC
  Administered 2012-07-14: 2 mg via INTRAVENOUS
  Filled 2012-07-14: qty 1

## 2012-07-14 MED ORDER — SULFAMETHOXAZOLE-TRIMETHOPRIM 800-160 MG PO TABS: 1.0000 | ORAL_TABLET | Freq: Two times a day (BID) | ORAL | Status: DC

## 2012-07-14 MED ORDER — OXYCODONE-ACETAMINOPHEN 5-325 MG PO TABS: ORAL_TABLET | ORAL | Status: DC

## 2012-07-14 MED ORDER — ONDANSETRON HCL 4 MG PO TABS: 4.0000 mg | ORAL_TABLET | Freq: Three times a day (TID) | ORAL | Status: DC | PRN

## 2012-07-14 NOTE — ED Notes (Signed)
Pt reports right axillary abscess x1 week. 3-4 days ago right under arm started to swell up. Lump noted under in right axillary region. Pain 10/10.

## 2012-07-14 NOTE — Progress Notes (Signed)
Pt confirms pcp is OSEI-BONSU, GEORGE at [facility]

## 2012-07-14 NOTE — ED Provider Notes (Addendum)
History     CSN: [id number]  Arrival date & time 07/14/12  0830   First MD Initiated Contact with Patient 07/14/12 [phone]      Chief Complaint  Patient presents with  . Abscess    (Consider location/radiation/quality/duration/timing/severity/associated sxs/prior treatment) HPI  Bridget Wilson is a 26 y.o. female complaining of multiple abscesses to right axilla worsening over the course of week. Patient is afebrile and denies nausea and vomiting. Pain is severe 10 out of 10 exacerbation by palpation or movement. Patient has had multiple similar episodes in the past. They seem to always be in the apocrine areas.  Past Medical History  Diagnosis Date  . Anxiety     little bit  . Chlamydia     Past Surgical History  Procedure Date  . Cholecystectomy, laparoscopic     Family History  Problem Relation Age of Onset  . Anesthesia problems Neg Hx   . Hypotension Neg Hx   . Malignant hyperthermia Neg Hx   . Pseudochol deficiency Neg Hx   . Hypertension Mother   . Diabetes Mother     History  Substance Use Topics  . Smoking status: Current Every Day Smoker    Types: Cigarettes  . Smokeless tobacco: Never Used     Comment: quit 08/12  . Alcohol Use: Yes    OB History    Grav Para Term Preterm Abortions TAB SAB Ect Mult Living   2 2 2  0 0 0 0 0 0 2      Review of Systems  Constitutional: Negative for fever.  Respiratory: Negative for shortness of breath.   Cardiovascular: Negative for chest pain.  Gastrointestinal: Negative for nausea, vomiting, abdominal pain and diarrhea.  Skin: Positive for rash.  All other systems reviewed and are negative.    Allergies  Review of patient's allergies indicates no known allergies.  Home Medications   Current Outpatient Rx  Name  Route  Sig  Dispense  Refill  . ACETAMINOPHEN 500 MG PO TABS   Oral   Take 1,000 mg by mouth every 6 (six) hours as needed. For pain         . SALONPAS EX PADS   Apply externally  Apply 1 each topically every 4 (four) hours as needed. pain           BP 109/64  Pulse 97  Temp 98.5 F (36.9 C) (Oral)  Resp 16  SpO2 98%  Breastfeeding? No  Physical Exam  Nursing note and vitals reviewed. Constitutional: She is oriented to person, place, and time. She appears well-developed and well-nourished. No distress.  HENT:  Head: Normocephalic.  Eyes: Conjunctivae normal and EOM are normal.  Cardiovascular: Normal rate and intact distal pulses.   Pulmonary/Chest: Effort normal and breath sounds normal. No stridor. No respiratory distress. She has no wheezes. She has no rales. She exhibits no tenderness.  Abdominal: Soft. Bowel sounds are normal.  Musculoskeletal: Normal range of motion.  Neurological: She is alert and oriented to person, place, and time.  Skin:       Multiple abscesses to right axilla and variable stages of induration and fluctuance. There is an approximately 12 cm area of induration, warmth and tenderness to the anterior shoulder area.  Psychiatric: She has a normal mood and affect.    ED Course  [place] bedside Date/Time: 07/14/2012 4:12 PM Performed by: Wynetta Emery Authorized by: Wynetta Emery Consent: Verbal consent obtained. Written consent not obtained. Risks and benefits: risks, benefits and  alternatives were discussed Consent given by: patient Patient understanding: patient states understanding of the procedure being performed Patient identity confirmed: arm band Comments: 2 areas of hypoechoic collections measuring approximately 2 cm one on the superior aspect of the fracture right axilla and one on the inferior aspect.   INCISION AND DRAINAGE Performed by: Wynetta Emery Consent: Verbal consent obtained. Risks and benefits: risks, benefits and alternatives were discussed Type: abscess  Body area: right axilla  Anesthesia: local infiltration  Local anesthetic: lidocaine 2% with epinephrine  Anesthetic total: 10  ml  Incisions made with an 11-blade scalpel   Complexity: complex Blunt dissection to break up loculations  Drainage: purulent  Drainage amount: 20  Packing material: None  Patient tolerance: Patient tolerated the procedure well with no immediate complications.     Labs Reviewed - No data to display No results found.   1. Abscess or cellulitis       MDM   Pt with multiple abscesses to right axilla. Multiple prior excerebration, this is likely hydradenitis. I will give her a surgical referral.    Bedside US shows 2 pockets of fluid collections. The 2 incisions made with profuse, foul smelling purulent drainage. Culture is sent.  Patient has a 12 cm surrounding area of cellulitis. I will mark the area for comparison. She will be started on Bactrim. She'll be instructed to return in 48 hours for a wound check.   Pt verbalized understanding and agrees with care plan. Outpatient follow-up and return precautions given.    New Prescriptions   ONDANSETRON (ZOFRAN) 4 MG TABLET    Take 1 tablet (4 mg total) by mouth every 8 (eight) hours as needed for nausea.   OXYCODONE-ACETAMINOPHEN (PERCOCET/ROXICET) 5-325 MG PER TABLET    1 to 2 tabs PO q6hrs  PRN for pain   SULFAMETHOXAZOLE-TRIMETHOPRIM (BACTRIM DS) 800-160 MG PER TABLET    Take 1 tablet by mouth 2 (two) times daily.        Wynetta Emery, PA-C 07/14/12 1619  Wynetta Emery, PA-C 07/23/12 1549

## 2012-07-14 NOTE — ED Provider Notes (Signed)
Medical screening examination/treatment/procedure(s) were performed by non-physician practitioner and as supervising physician I was immediately available for consultation/collaboration.  Sanyla Summey, MD 07/14/12 1645 

## 2012-07-17 LAB — WOUND CULTURE

## 2012-07-26 NOTE — ED Provider Notes (Signed)
Medical screening examination/treatment/procedure(s) were performed by non-physician practitioner and as supervising physician I was immediately available for consultation/collaboration.  Doug Sou, MD 07/26/12 1258

## 2013-09-04 ENCOUNTER — Encounter (HOSPITAL_COMMUNITY): Payer: Self-pay | Admitting: Emergency Medicine

## 2013-09-04 ENCOUNTER — Emergency Department (HOSPITAL_COMMUNITY)
Admission: EM | Admit: 2013-09-04 | Discharge: 2013-09-04 | Disposition: A | Discharge status: Home / Self Care | Payer: Medicaid Other | Attending: Emergency Medicine | Admitting: Emergency Medicine

## 2013-09-04 DIAGNOSIS — Y929 Unspecified place or not applicable: Secondary | ICD-10-CM | POA: Insufficient documentation

## 2013-09-04 DIAGNOSIS — F411 Generalized anxiety disorder: Secondary | ICD-10-CM | POA: Insufficient documentation

## 2013-09-04 DIAGNOSIS — F17200 Nicotine dependence, unspecified, uncomplicated: Secondary | ICD-10-CM | POA: Insufficient documentation

## 2013-09-04 DIAGNOSIS — X58XXXA Exposure to other specified factors, initial encounter: Secondary | ICD-10-CM | POA: Insufficient documentation

## 2013-09-04 DIAGNOSIS — Z8619 Personal history of other infectious and parasitic diseases: Secondary | ICD-10-CM | POA: Insufficient documentation

## 2013-09-04 DIAGNOSIS — S335XXA Sprain of ligaments of lumbar spine, initial encounter: Secondary | ICD-10-CM | POA: Insufficient documentation

## 2013-09-04 DIAGNOSIS — Y939 Activity, unspecified: Secondary | ICD-10-CM | POA: Insufficient documentation

## 2013-09-04 DIAGNOSIS — S39012A Strain of muscle, fascia and tendon of lower back, initial encounter: Secondary | ICD-10-CM

## 2013-09-04 MED ORDER — KETOROLAC TROMETHAMINE 30 MG/ML IJ SOLN
30.0000 mg | Freq: Once | INTRAMUSCULAR | Status: AC
Start: 2013-09-04 — End: 2013-09-04
  Administered 2013-09-04: 30 mg via INTRAMUSCULAR
  Filled 2013-09-04: qty 1

## 2013-09-04 MED ORDER — OXYCODONE-ACETAMINOPHEN 5-325 MG PO TABS: 1.0000 | ORAL_TABLET | ORAL | Status: DC | PRN

## 2013-09-04 MED ORDER — DIAZEPAM 5 MG PO TABS: 2.5000 mg | ORAL_TABLET | Freq: Four times a day (QID) | ORAL | Status: DC | PRN

## 2013-09-04 MED ORDER — MORPHINE SULFATE 4 MG/ML IJ SOLN
6.0000 mg | Freq: Once | INTRAMUSCULAR | Status: AC
Start: 2013-09-04 — End: 2013-09-04
  Administered 2013-09-04: 6 mg via INTRAMUSCULAR
  Filled 2013-09-04: qty 2

## 2013-09-04 MED ORDER — NAPROXEN 500 MG PO TABS: 500.0000 mg | ORAL_TABLET | Freq: Two times a day (BID) | ORAL | Status: DC | PRN

## 2013-09-04 MED ORDER — DIAZEPAM 5 MG PO TABS
5.0000 mg | ORAL_TABLET | Freq: Once | ORAL | Status: AC
  Administered 2013-09-04: 5 mg via ORAL
  Filled 2013-09-04: qty 1

## 2013-09-04 NOTE — ED Notes (Signed)
Pt here for back pain, relates pain to picking up 28 year old often, denies hx of back problems.

## 2013-09-04 NOTE — Discharge Instructions (Signed)
Back Pain, Adult Low back pain is very common. About 1 in 5 people have back pain.The cause of low back pain is rarely dangerous. The pain often gets better over time.About half of people with a sudden onset of back pain feel better in just 2 weeks. About 8 in 10 people feel better by 6 weeks.  CAUSES Some common causes of back pain include:  Strain of the muscles or ligaments supporting the spine.  Wear and tear (degeneration) of the spinal discs.  Arthritis.  Direct injury to the back. DIAGNOSIS Most of the time, the direct cause of low back pain is not known.However, back pain can be treated effectively even when the exact cause of the pain is unknown.Answering your caregiver's questions about your overall health and symptoms is one of the most accurate ways to make sure the cause of your pain is not dangerous. If your caregiver needs more information, he or she may order lab work or imaging tests (X-rays or MRIs).However, even if imaging tests show changes in your back, this usually does not require surgery. HOME CARE INSTRUCTIONS For many people, back pain returns.Since low back pain is rarely dangerous, it is often a condition that people can learn to manageon their own.   Remain active. It is stressful on the back to sit or stand in one place. Do not sit, drive, or stand in one place for more than 30 minutes at a time. Take short walks on level surfaces as soon as pain allows.Try to increase the length of time you walk each day.  Do not stay in bed.Resting more than 1 or 2 days can delay your recovery.  Do not avoid exercise or work.Your body is made to move.It is not dangerous to be active, even though your back may hurt.Your back will likely heal faster if you return to being active before your pain is gone.  Pay attention to your body when you bend and lift. Many people have less discomfortwhen lifting if they bend their knees, keep the load close to their bodies,and  avoid twisting. Often, the most comfortable positions are those that put less stress on your recovering back.  Find a comfortable position to sleep. Use a firm mattress and lie on your side with your knees slightly bent. If you lie on your back, put a pillow under your knees.  Only take over-the-counter or prescription medicines as directed by your caregiver. Over-the-counter medicines to reduce pain and inflammation are often the most helpful.Your caregiver may prescribe muscle relaxant drugs.These medicines help dull your pain so you can more quickly return to your normal activities and healthy exercise.  Put ice on the injured area.  Put ice in a plastic bag.  Place a towel between your skin and the bag.  Leave the ice on for 15-20 minutes, 03-04 times a day for the first 2 to 3 days. After that, ice and heat may be alternated to reduce pain and spasms.  Ask your caregiver about trying back exercises and gentle massage. This may be of some benefit.  Avoid feeling anxious or stressed.Stress increases muscle tension and can worsen back pain.It is important to recognize when you are anxious or stressed and learn ways to manage it.Exercise is a great option. SEEK MEDICAL CARE IF:  You have pain that is not relieved with rest or medicine.  You have pain that does not improve in 1 week.  You have new symptoms.  You are generally not feeling well. SEEK   IMMEDIATE MEDICAL CARE IF:   You have pain that radiates from your back into your legs.  You develop new bowel or bladder control problems.  You have unusual weakness or numbness in your arms or legs.  You develop nausea or vomiting.  You develop abdominal pain.  You feel faint. Document Released: 08/19/2005 Document Revised: 02/18/2012 Document Reviewed: 01/07/2011 [employer] Patient Information 2014 [employer].  

## 2013-09-10 NOTE — ED Provider Notes (Addendum)
CSN: [id number]     Arrival date & time 09/04/13  0753 History   First MD Initiated Contact with Patient 09/04/13 0757     Chief Complaint  Patient presents with  . Back Pain   (Consider location/radiation/quality/duration/timing/severity/associated sxs/prior Treatment) HPI  28 year old female with lower back pain. Gradual onset 2-3 days ago and progressively worsening. Denies any acute trauma. Pain is constant and worse with movement. Does not radiate. No numbness or tingling. No change in her strength. No urinary complaints. No fevers or chills. No history of prior back surgery. No use of blood thinning medication.  Past Medical History  Diagnosis Date  . Anxiety     little bit  . Chlamydia    Past Surgical History  Procedure Laterality Date  . Cholecystectomy, laparoscopic     Family History  Problem Relation Age of Onset  . Anesthesia problems Neg Hx   . Hypotension Neg Hx   . Malignant hyperthermia Neg Hx   . Pseudochol deficiency Neg Hx   . Hypertension Mother   . Diabetes Mother    History  Substance Use Topics  . Smoking status: Current Every Day Smoker    Types: Cigarettes  . Smokeless tobacco: Never Used     Comment: quit 08/12  . Alcohol Use: Yes   OB History   Grav Para Term Preterm Abortions TAB SAB Ect Mult Living   2 2 2  0 0 0 0 0 0 2     Review of Systems  All systems reviewed and negative, other than as noted in HPI.   Allergies  Review of patient's allergies indicates no known allergies.  Home Medications   Current Outpatient Rx  Name  Route  Sig  Dispense  Refill  . acetaminophen (TYLENOL) 500 MG tablet   Oral   Take 1,000 mg by mouth every 6 (six) hours as needed. For pain         . ibuprofen (ADVIL,MOTRIN) 200 MG tablet   Oral   Take 400 mg by mouth every 6 (six) hours as needed for headache or mild pain.         . diazepam (VALIUM) 5 MG tablet   Oral   Take 0.5 tablets (2.5 mg total) by mouth every 6 (six) hours as needed for  muscle spasms.   8 tablet   0   . naproxen (NAPROSYN) 500 MG tablet   Oral   Take 1 tablet (500 mg total) by mouth 2 (two) times daily as needed.   20 tablet   0   . oxyCODONE-acetaminophen (PERCOCET/ROXICET) 5-325 MG per tablet   Oral   Take 1 tablet by mouth every 4 (four) hours as needed for severe pain.   10 tablet   0    BP 116/72  Pulse 86  Temp(Src) 97.4 F (36.3 C) (Oral)  Resp 20  Ht 5\' 8"  (1.727 m)  Wt 235 lb (106.595 kg)  BMI 35.74 kg/m2  SpO2 99% Physical Exam  Nursing note and vitals reviewed. Constitutional: She appears well-developed and well-nourished. No distress.  HENT:  Head: Normocephalic and atraumatic.  Eyes: Conjunctivae are normal. Right eye exhibits no discharge. Left eye exhibits no discharge.  Neck: Neck supple.  Cardiovascular: Normal rate, regular rhythm and normal heart sounds.  Exam reveals no gallop and no friction rub.   No murmur heard. Pulmonary/Chest: Effort normal and breath sounds normal. No respiratory distress.  Abdominal: Soft. She exhibits no distension. There is no tenderness.  Musculoskeletal: She exhibits  no edema and no tenderness.  Tenderness to palpation across the mid to lower lumbar region including the midline. There is no increased tenderness at the midline the. No concerning skin changes.  Neurological: She is alert.   is 5 out of 5 bilateral lower extremities. Patellar reflexes are one plus. Sensation is intact to light touch. Ambulating  without apparent difficulty.   Skin: Skin is warm and dry.  Psychiatric: She has a normal mood and affect. Her behavior is normal. Thought content normal.    ED Course  Procedures (including critical care time) Labs Review Labs Reviewed - No data to display Imaging Review No results found.  EKG Interpretation   None       MDM   1. Lumbosacral strain, initial encounter    28 year old female with atraumatic lower back pain. Likely lumbosacral strain. Imaging likely  little utility. Nonfocal neurological examination. Plan symptomatic treatment at this time.     Virgel Manifold, MD 09/10/13 [phone]

## 2013-10-06 IMAGING — US US OB COMP LESS 14 WK
1 series · 14 of 28 positions shown · non-contrast
Comparison: None.

CLINICAL DATA: bleeding and cramping in early pregnancy,bleeding; ;

OBSTETRIC <14 WK US AND TRANSVAGINAL OB US
TECHNIQUE: Both transabdominal and transvaginal ultrasound
examinations were performed for complete evaluation of the
gestation as well as the maternal uterus, adnexal regions, and
pelvic cul-de-sac.

[Series 1: us ob comp less 14 wks · 14 of 34 slices shown]
[im 2/34]
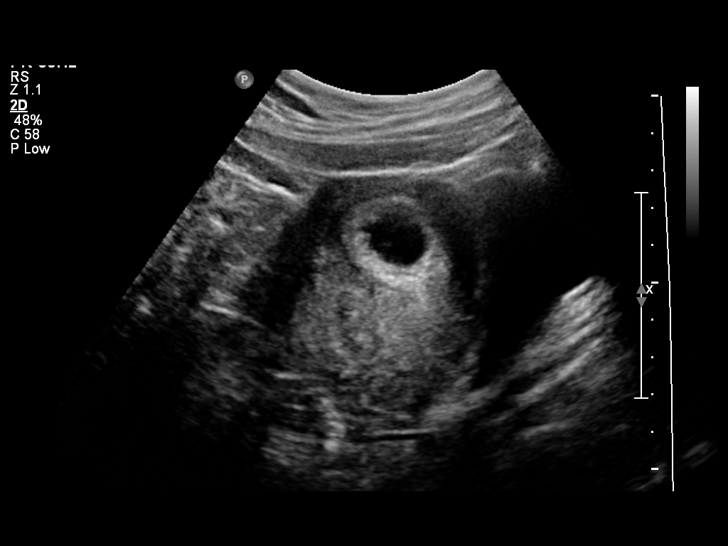
[im 4/34]
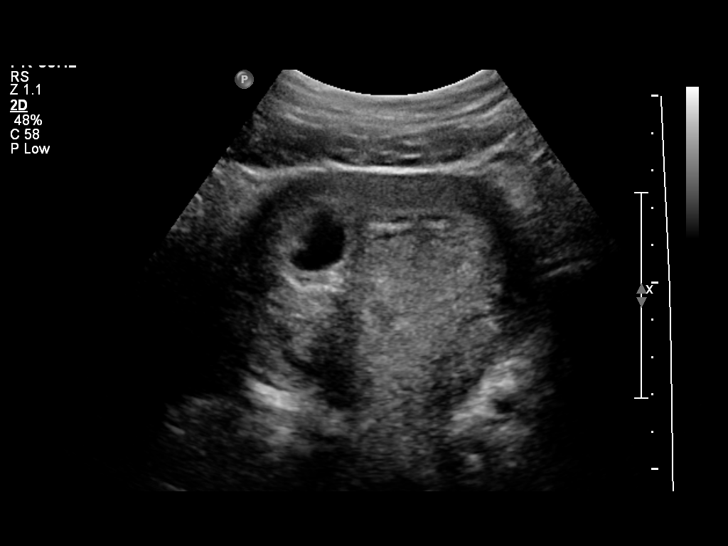
[im 7/34]
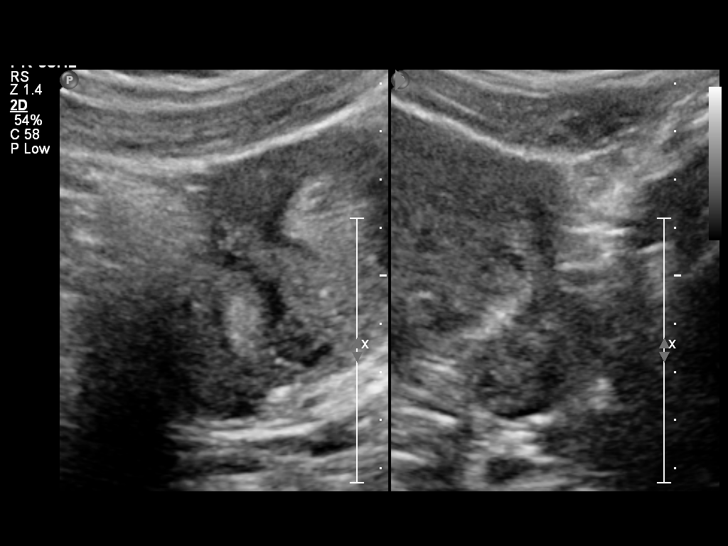
[im 9/34]
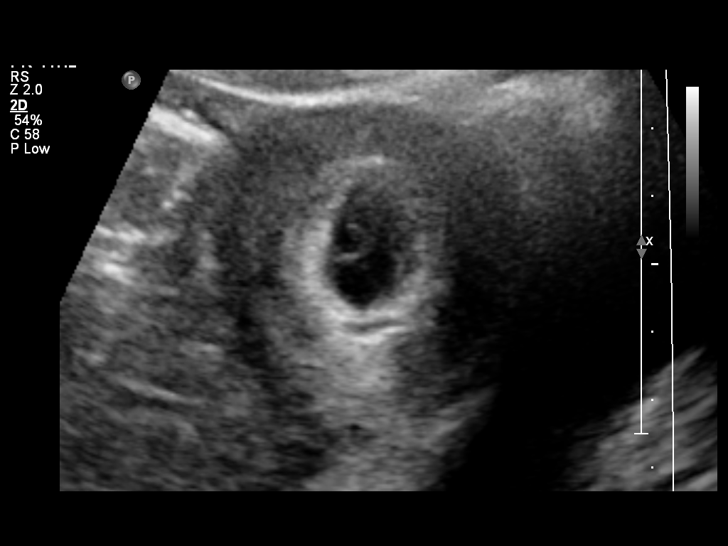
[im 12/34]
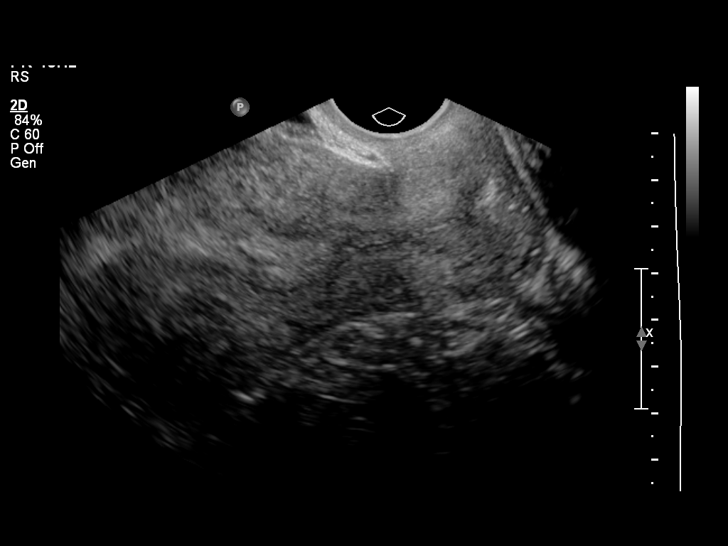
[im 14/34]
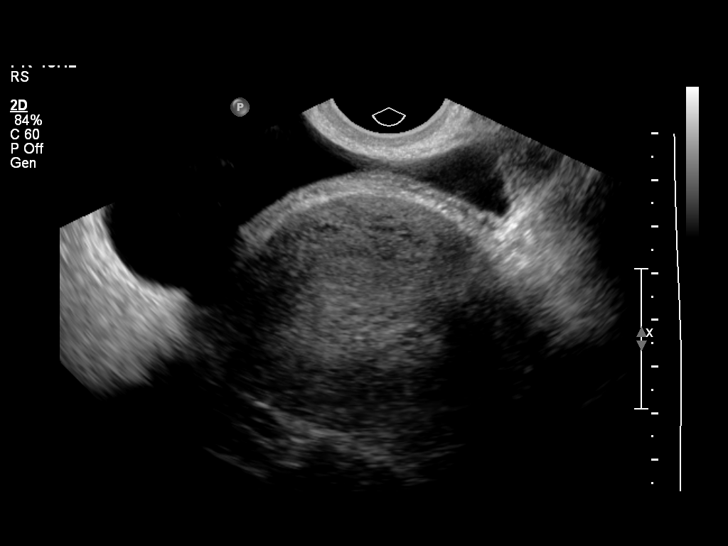
[im 16/34]
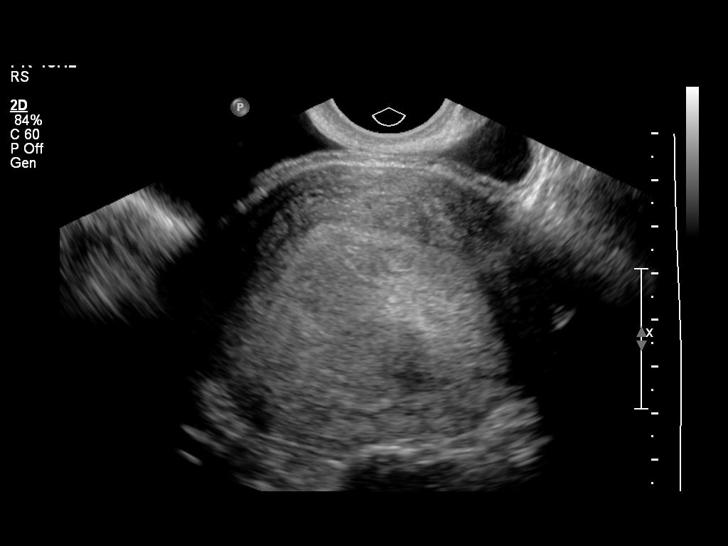
[im 19/34]
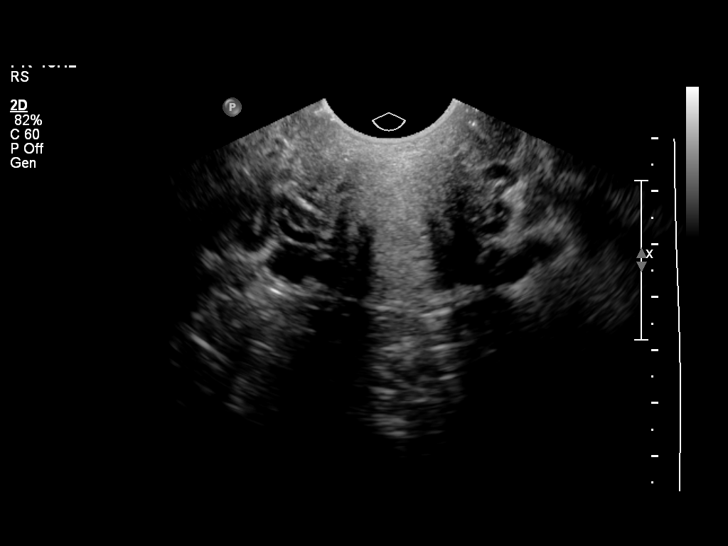
[im 21/34]
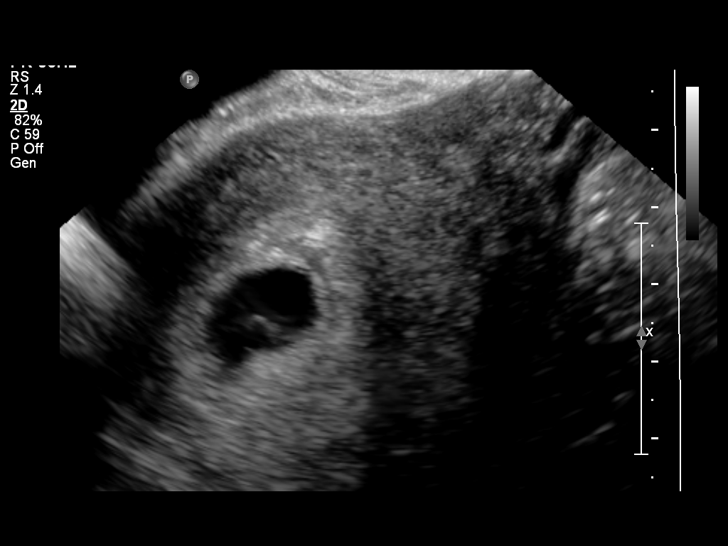
[im 24/34]
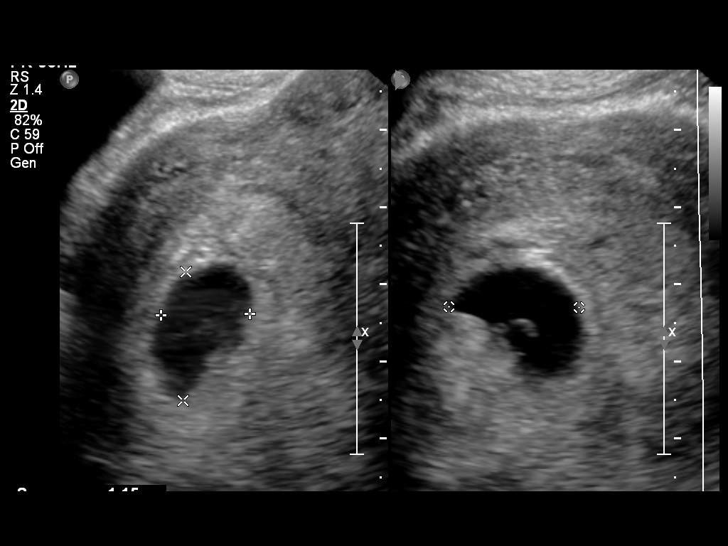
[im 26/34]
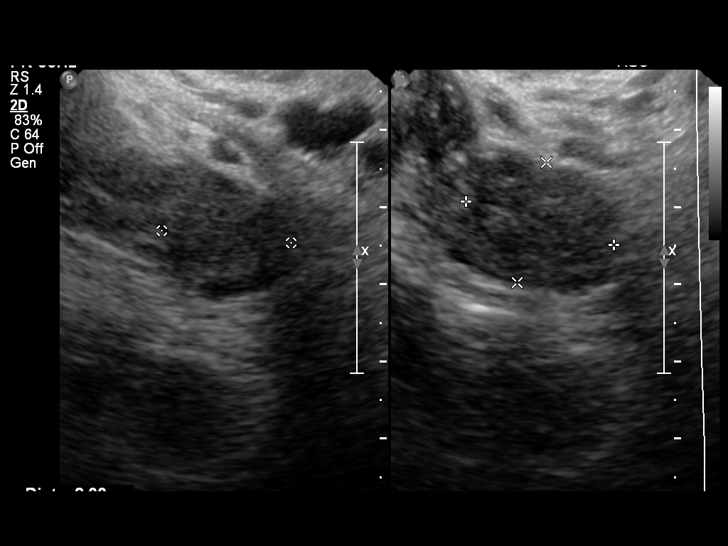
[im 29/34]
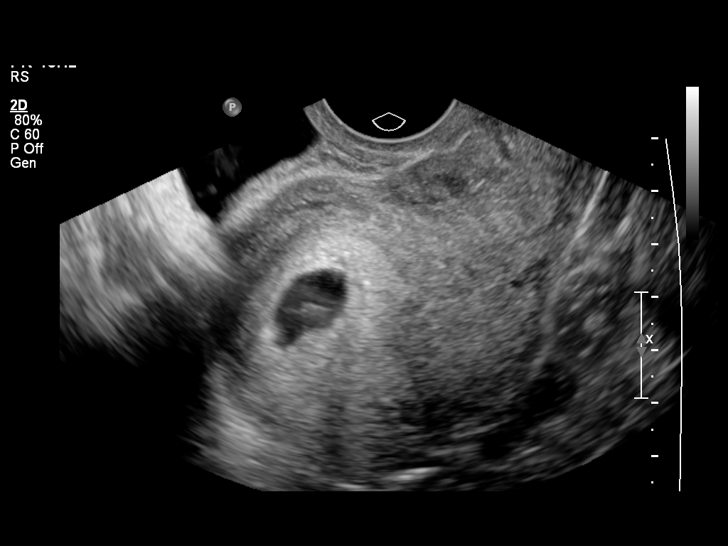
[im 31/34]
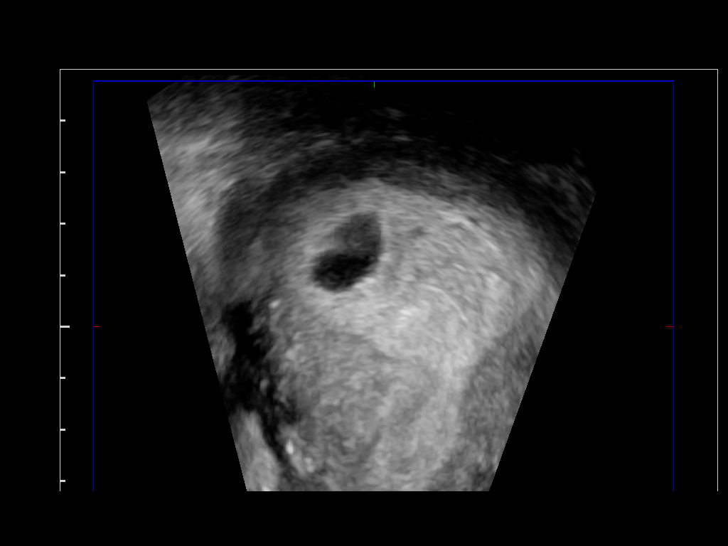
[im 34/34]
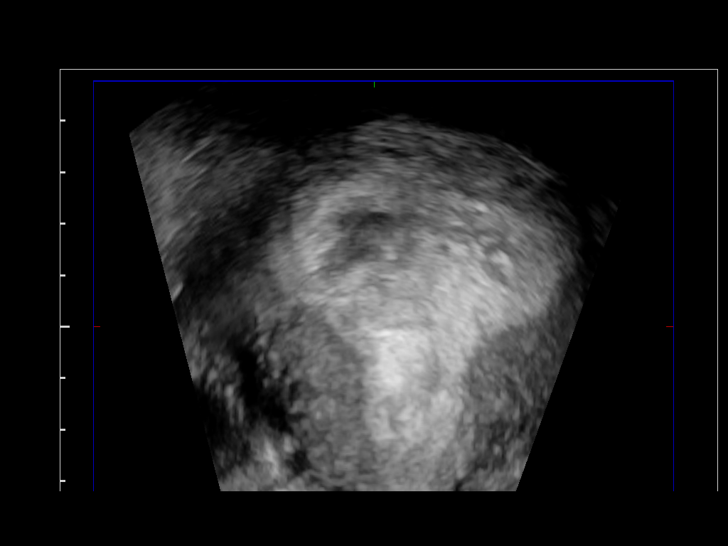

[14 of 28 positions shown; findings below may reference images not displayed]

FINDINGS: There is a single intrauterine gestation.  Mean sac
diameter is 15 mm for an  estimated gestational age of 6 weeks 2
days.  No fetal pole.  There is a yolk sac visualized.  No
subchorionic hemorrhage.

Right corpus luteal cyst.  Left ovary unremarkable.  No free fluid.
IMPRESSION: 6-week-6-day intrauterine pregnancy.  Yolk sac visualized.  No
fetal pole currently.

## 2013-10-13 IMAGING — US US OB TRANSVAGINAL
1 series · 13 of 28 positions shown · non-contrast
Comparison: none

[Series 1: us ob transvaginal · 30 acquisitions, 13 frames shown]
[im 2/30]
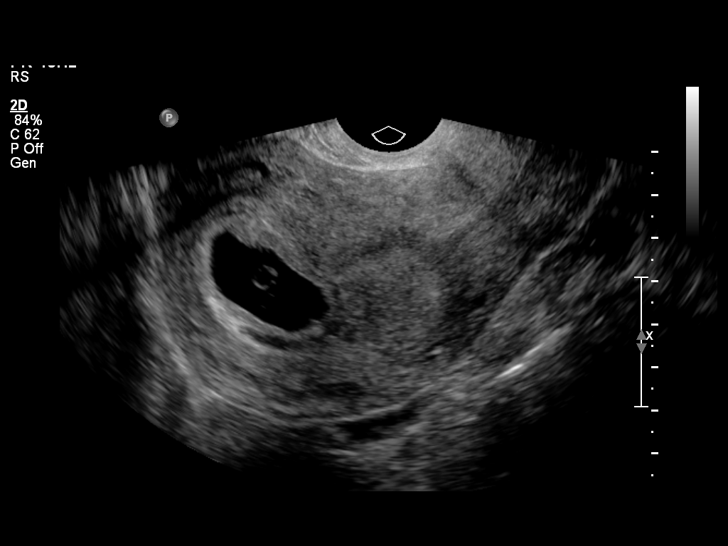
[im 4/30]
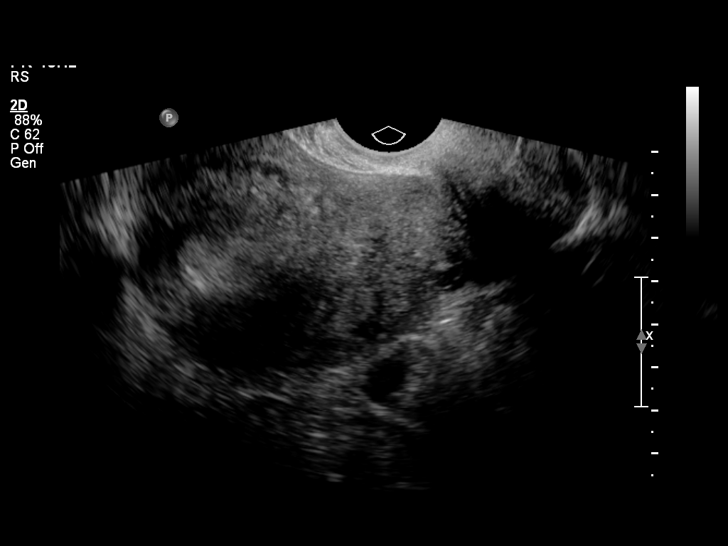
[im 6/30]
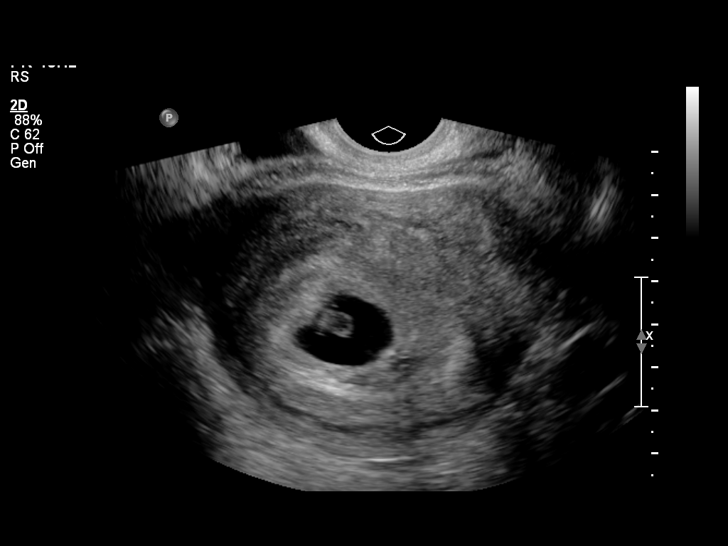
[im 8/30]
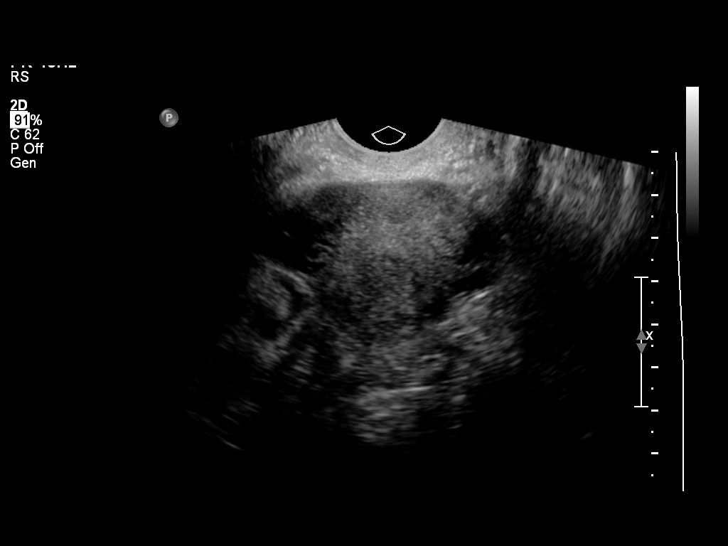
[im 10/30]
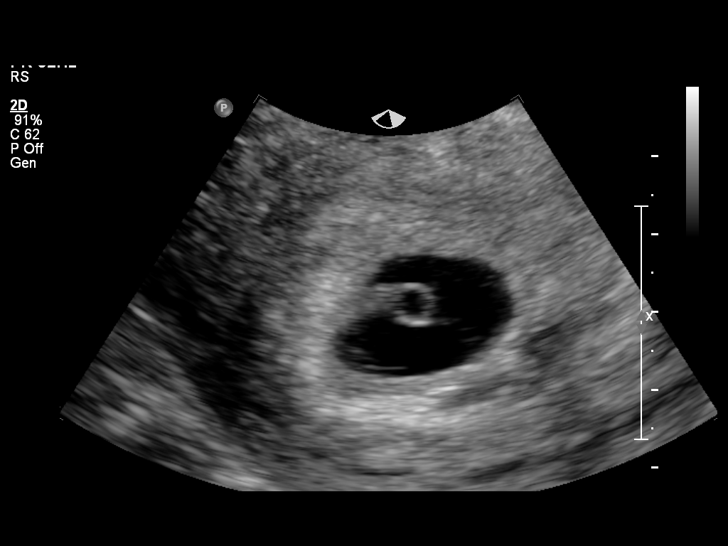
[im 12/30]
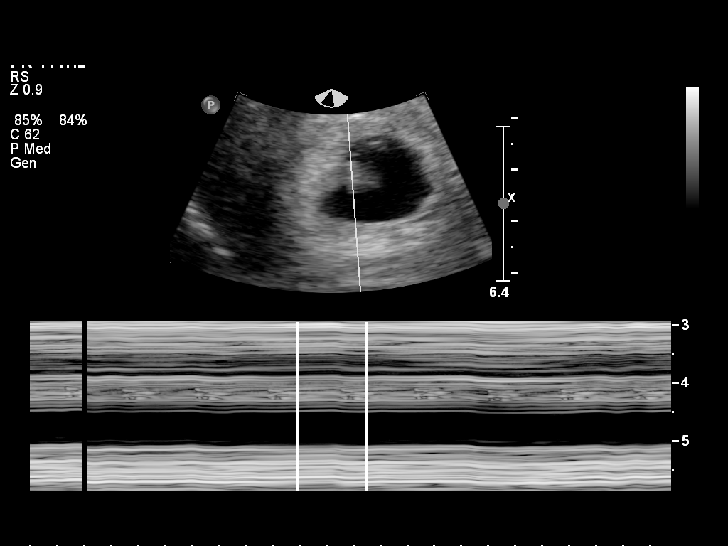
[im 16/30]
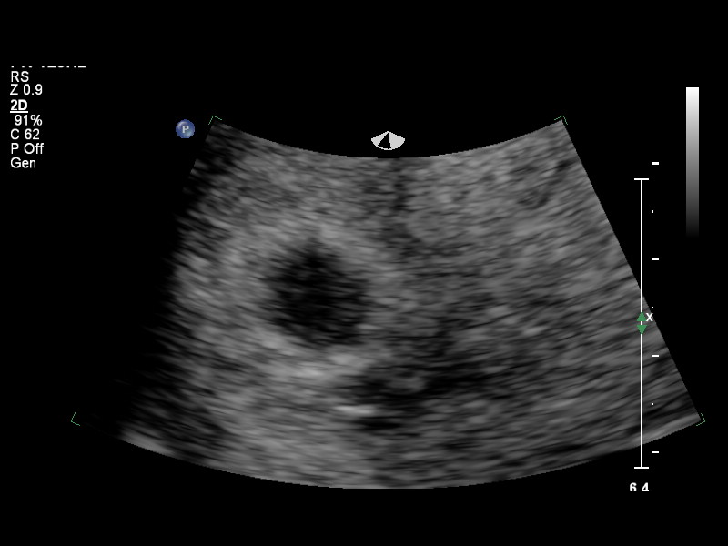
[im 18/30]
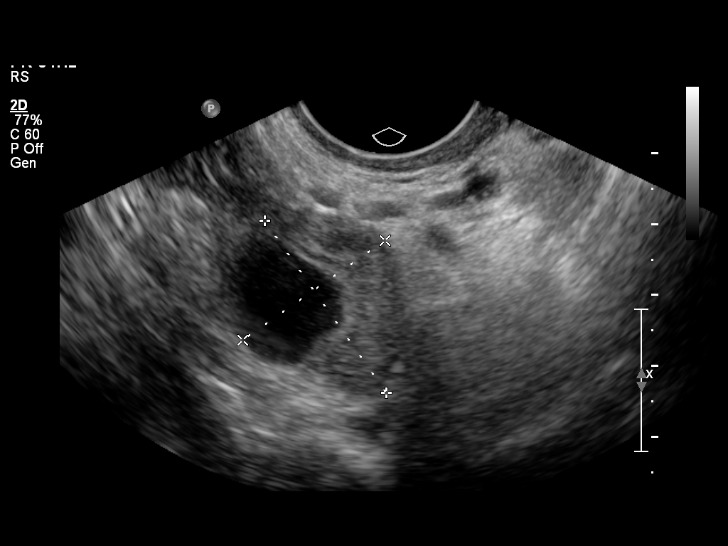
[im 20/30]
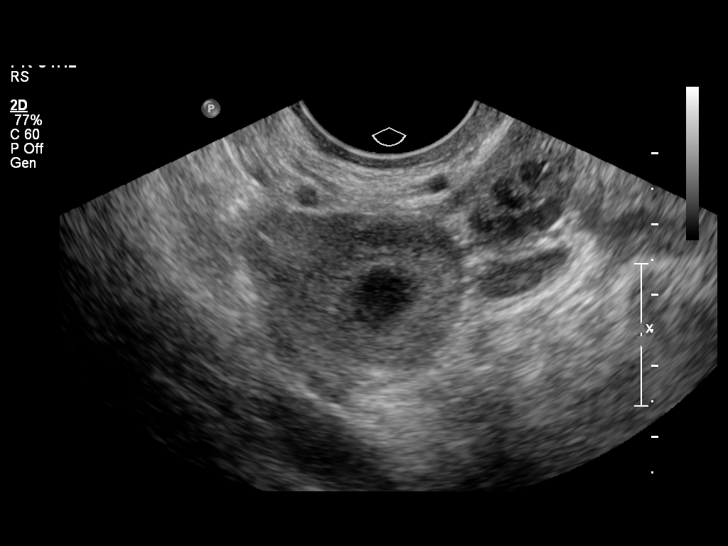
[im 22/30]
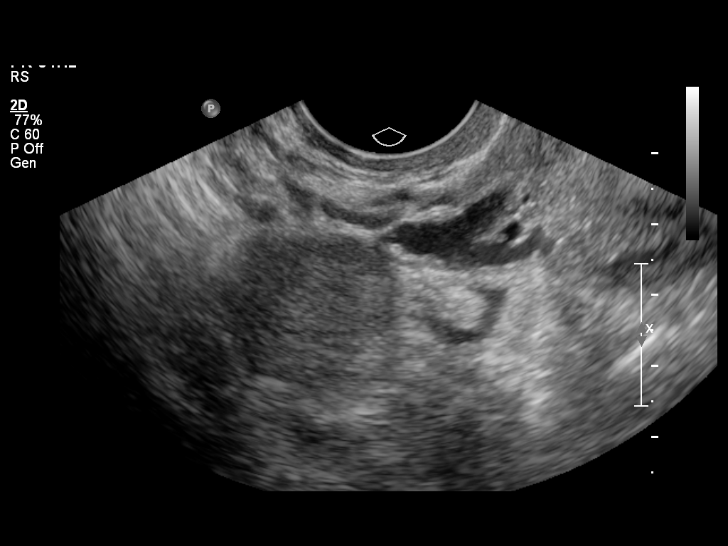
[im 24/30]
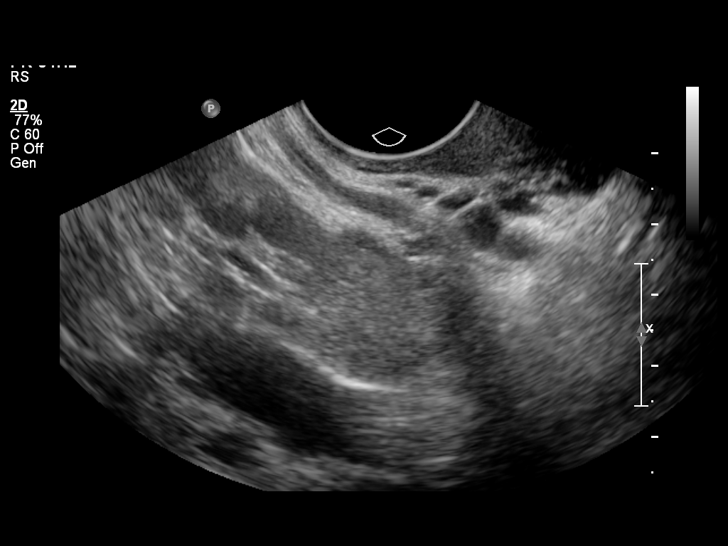
[im 26/30]
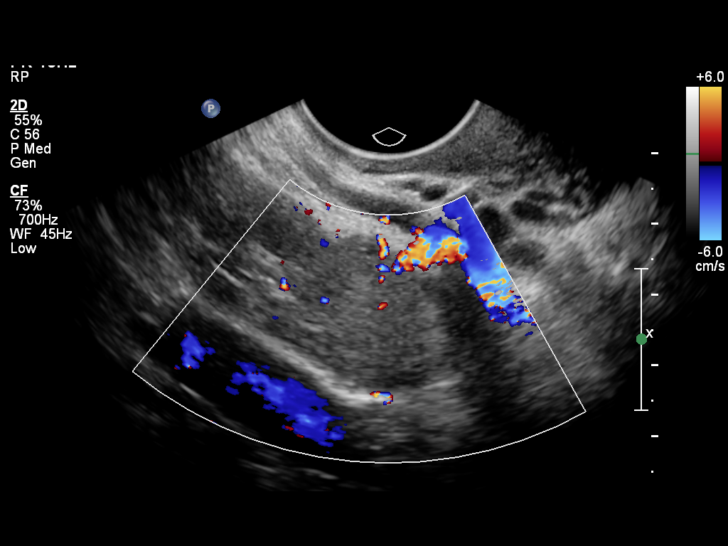
[im 28/30]
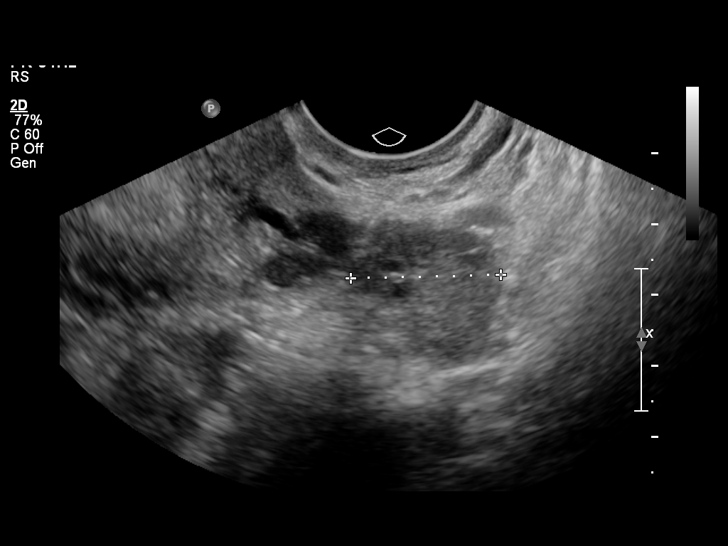

[13 of 28 positions shown; findings below may reference images not displayed]

OBSTETRICS REPORT
                      (Signed Final 06/24/2011 [DATE])

Procedures

 US OB TRANSVAGINAL                                    76817.0
Indications

 Vaginal bleeding, unknown etiology
 Pain - Abdominal/Pelvic
 No fetal cardiac activity detected;  assess viability
Fetal Evaluation

 Preg. Location:    Intrauterine
 Gest. Sac:         Intrauterine
 Yolk Sac:          Visualized
 Fetal Pole:        Visualized, intrauterine
 Fetal Heart Rate:  117                          bpm
 Cardiac Activity:  Observed
Biometry

 CRL:      7.5  mm     G. Age:  6w 5d                  EDD:    02/12/12
Gestational Age

 LMP:           6w 5d         Date:  05/08/11                 EDD:   02/12/12
 Best:          6w 5d      Det. By:  U/S C R L (06/24/11)     EDD:   02/12/12
Cervix Uterus Adnexa

 Cervix:       Closed.
 Uterus:       No abnormality visualized.
 Cul De Sac:   No free fluid seen.
 Left Ovary:    Within normal limits.
 Right Ovary:   Within normal limits. Small corpus luteum noted.
 Adnexa:     No abnormality visualized.
Impression

 Single living IUP with US Gest. Age of 6w 5d, and EDD of
 02/12/2012.
 No significant maternal uterine or adnexal abnormality
 identified.

## 2013-10-26 ENCOUNTER — Encounter (HOSPITAL_COMMUNITY): Payer: Self-pay | Admitting: *Deleted

## 2013-10-26 ENCOUNTER — Inpatient Hospital Stay (HOSPITAL_COMMUNITY)
Admission: AD | Admit: 2013-10-26 | Discharge: 2013-10-26 | Disposition: A | Discharge status: Home / Self Care | Payer: Medicaid Other | Source: Ambulatory Visit | Attending: Obstetrics | Admitting: Obstetrics

## 2013-10-26 DIAGNOSIS — N949 Unspecified condition associated with female genital organs and menstrual cycle: Secondary | ICD-10-CM | POA: Insufficient documentation

## 2013-10-26 DIAGNOSIS — N920 Excessive and frequent menstruation with regular cycle: Secondary | ICD-10-CM

## 2013-10-26 DIAGNOSIS — R109 Unspecified abdominal pain: Secondary | ICD-10-CM | POA: Insufficient documentation

## 2013-10-26 DIAGNOSIS — F17200 Nicotine dependence, unspecified, uncomplicated: Secondary | ICD-10-CM | POA: Insufficient documentation

## 2013-10-26 DIAGNOSIS — N898 Other specified noninflammatory disorders of vagina: Secondary | ICD-10-CM

## 2013-10-26 DIAGNOSIS — N938 Other specified abnormal uterine and vaginal bleeding: Secondary | ICD-10-CM | POA: Insufficient documentation

## 2013-10-26 DIAGNOSIS — M549 Dorsalgia, unspecified: Secondary | ICD-10-CM | POA: Insufficient documentation

## 2013-10-26 LAB — POCT PREGNANCY, URINE: PREG TEST UR: NEGATIVE

## 2013-10-26 LAB — WET PREP, GENITAL
CLUE CELLS WET PREP: NONE SEEN
TRICH WET PREP: NONE SEEN
Yeast Wet Prep HPF POC: NONE SEEN

## 2013-10-26 LAB — URINALYSIS, ROUTINE W REFLEX MICROSCOPIC
Bilirubin Urine: NEGATIVE
Glucose, UA: NEGATIVE mg/dL
Ketones, ur: NEGATIVE mg/dL
Leukocytes, UA: NEGATIVE
NITRITE: NEGATIVE
Protein, ur: NEGATIVE mg/dL
SPECIFIC GRAVITY, URINE: 1.01 (ref 1.005–1.030)
UROBILINOGEN UA: 0.2 mg/dL (ref 0.0–1.0)
pH: 7 (ref 5.0–8.0)

## 2013-10-26 LAB — URINE MICROSCOPIC-ADD ON

## 2013-10-26 NOTE — MAU Note (Signed)
Pt states she started having cramping and spotting for about 2 wks

## 2013-10-26 NOTE — MAU Provider Note (Signed)
History   Bridget Wilson is a 28 yo female reporting to MAU for abdominal pain, vaginal bleeding and back pain for the past two weeks. The symptoms started two weeks ago.  The bleeding is light, with the patient using only one panty liner per day. Her last period was in January and it is late for February.  The patient typically experiences back pain when it's time for her period, but the period did not start.  Last sexual intercourse was 2 weeks ago with a new partner and without a condom.  Patient is not on any form of contraception.  She states that she was a patient of Dr. Ruthann Cancer, but since he no longer sees GYN patients, she needs a new provider.  CSN: [id number]  Arrival date and time: 10/26/13 1412   First Provider Initiated Contact with Patient 10/26/13 1542      Chief Complaint  Patient presents with  . Abdominal Pain  . Vaginal Bleeding  . Back Pain   Abdominal Pain This is a new problem. The current episode started 1 to 4 weeks ago. The onset quality is undetermined. The problem occurs daily. The problem has been unchanged. The pain is located in the suprapubic region. The pain is moderate. The quality of the pain is cramping. The abdominal pain does not radiate. Nothing aggravates the pain. The pain is relieved by nothing. She has tried nothing for the symptoms.  Vaginal Bleeding The patient's primary symptoms include pelvic pain and vaginal bleeding. This is a new problem. The current episode started 1 to 4 weeks ago. The problem occurs daily. The problem has been unchanged. The patient is experiencing no pain. She is not pregnant. Associated symptoms include abdominal pain and back pain. The vaginal discharge was bloody and scant. The vaginal bleeding is spotting. She has not been passing clots. She has not been passing tissue. Nothing aggravates the symptoms. She has tried nothing for the symptoms. She is sexually active. It is unknown whether or not her partner has an STD. She uses  nothing for contraception. Her menstrual history has been regular.  Back Pain This is a recurrent problem. The current episode started 1 to 4 weeks ago. The problem occurs intermittently. The problem is unchanged. The quality of the pain is described as aching. The pain does not radiate. The pain is moderate. Associated symptoms include abdominal pain and pelvic pain. Risk factors include sedentary lifestyle, lack of exercise and obesity. She has tried nothing for the symptoms.      Past Medical History  Diagnosis Date  . Anxiety     little bit  . Chlamydia     Past Surgical History  Procedure Laterality Date  . Cholecystectomy, laparoscopic      Family History  Problem Relation Age of Onset  . Anesthesia problems Neg Hx   . Hypotension Neg Hx   . Malignant hyperthermia Neg Hx   . Pseudochol deficiency Neg Hx   . Hypertension Mother   . Diabetes Mother     History  Substance Use Topics  . Smoking status: Current Every Day Smoker    Types: Cigarettes  . Smokeless tobacco: Never Used     Comment: quit 08/12  . Alcohol Use: Yes    Allergies: No Known Allergies  Prescriptions prior to admission  Medication Sig Dispense Refill  . ibuprofen (ADVIL,MOTRIN) 200 MG tablet Take 400 mg by mouth every 6 (six) hours as needed for headache or mild pain.      Marland Kitchen  acetaminophen (TYLENOL) 500 MG tablet Take 1,000 mg by mouth every 6 (six) hours as needed. For pain      . diazepam (VALIUM) 5 MG tablet Take 0.5 tablets (2.5 mg total) by mouth every 6 (six) hours as needed for muscle spasms.  8 tablet  0  . naproxen (NAPROSYN) 500 MG tablet Take 1 tablet (500 mg total) by mouth 2 (two) times daily as needed.  20 tablet  0  . oxyCODONE-acetaminophen (PERCOCET/ROXICET) 5-325 MG per tablet Take 1 tablet by mouth every 4 (four) hours as needed for severe pain.  10 tablet  0    Review of Systems  Constitutional: Negative.   HENT: Negative.   Eyes: Negative.   Respiratory: Negative.    Cardiovascular: Negative.   Gastrointestinal: Positive for abdominal pain.  Genitourinary: Positive for vaginal bleeding and pelvic pain.       Spotting  Musculoskeletal: Positive for back pain.  Skin: Negative.   Neurological: Negative.   Endo/Heme/Allergies: Negative.   Psychiatric/Behavioral: Negative.    Physical Exam   Blood pressure 123/62, pulse 91, temperature 98.9 F (37.2 C), temperature source Oral, resp. rate 18, height 5\' 7"  (1.702 m), weight 115.214 kg (254 lb), last menstrual period 09/15/2013, not currently breastfeeding.  Physical Exam  Constitutional: She is oriented to person, place, and time. She appears well-developed and well-nourished.  Eyes: Conjunctivae are normal. Pupils are equal, round, and reactive to light.  Neck: Normal range of motion.  Cardiovascular: Normal rate and regular rhythm.   Respiratory: Effort normal and breath sounds normal.  GI: Soft. There is tenderness.  Genitourinary: Vagina normal and uterus normal.  Musculoskeletal: Normal range of motion.  Neurological: She is alert and oriented to person, place, and time.  Skin: Skin is warm and dry.  Psychiatric: She has a normal mood and affect. Thought content normal.    MAU Course  Procedures  MDM Results for orders placed during the hospital encounter of 10/26/13 (from the past 24 hour(s))  URINALYSIS, ROUTINE W REFLEX MICROSCOPIC     Status: Abnormal   Collection Time    10/26/13  2:35 PM      Result Value Ref Range   Color, Urine YELLOW  YELLOW   APPearance CLEAR  CLEAR   Specific Gravity, Urine 1.010  1.005 - 1.030   pH 7.0  5.0 - 8.0   Glucose, UA NEGATIVE  NEGATIVE mg/dL   Hgb urine dipstick TRACE (*) NEGATIVE   Bilirubin Urine NEGATIVE  NEGATIVE   Ketones, ur NEGATIVE  NEGATIVE mg/dL   Protein, ur NEGATIVE  NEGATIVE mg/dL   Urobilinogen, UA 0.2  0.0 - 1.0 mg/dL   Nitrite NEGATIVE  NEGATIVE   Leukocytes, UA NEGATIVE  NEGATIVE  URINE MICROSCOPIC-ADD ON     Status:  Abnormal   Collection Time    10/26/13  2:35 PM      Result Value Ref Range   Squamous Epithelial / LPF FEW (*) RARE   WBC, UA 0-2  <3 WBC/hpf   RBC / HPF 3-6  <3 RBC/hpf   Bacteria, UA FEW (*) RARE  POCT PREGNANCY, URINE     Status: None   Collection Time    10/26/13  2:36 PM      Result Value Ref Range   Preg Test, Ur NEGATIVE  NEGATIVE  WET PREP, GENITAL     Status: Abnormal   Collection Time    10/26/13  3:53 PM      Result Value Ref Range  Yeast Wet Prep HPF POC NONE SEEN  NONE SEEN   Trich, Wet Prep NONE SEEN  NONE SEEN   Clue Cells Wet Prep HPF POC NONE SEEN  NONE SEEN   WBC, Wet Prep HPF POC MANY (*) NONE SEEN     Assessment and Plan  28 yo female with a normal vaginal exam (negative for yeast, trich, and clue cells) GC/CT Pending  Discharge home Anticipate call from clinic with test results Advised to use condoms during intercourse and to consider contraception  Ivin Booty 10/26/2013, 3:54 PM   Evaluation and management procedures were performed by the midwife under my supervision and collaboration. I have reviewed the note and chart, and I agree with the management and plan.  Darrelyn Hillock Aldair Rickel, NP 10/26/2013 5:02 PM

## 2013-10-26 NOTE — MAU Note (Signed)
Has been spotting the past 2 wks, cramping in lower abd the past wk.  Back pain, cramping in low back for one wk.  Has been watery d/c past wk.

## 2013-10-26 NOTE — MAU Provider Note (Signed)
Attestation of Attending Supervision of Advanced Practitioner (PA/CNM/NP): Evaluation and management procedures were performed by the Advanced Practitioner under my supervision and collaboration.  I have reviewed the Advanced Practitioner's note and chart, and I agree with the management and plan.  Donnamae Jude, MD [facility] Attending 10/26/2013 7:05 PM

## 2013-10-29 LAB — GC/CHLAMYDIA PROBE AMP
CT PROBE, AMP APTIMA: NEGATIVE
GC Probe RNA: NEGATIVE

## 2014-02-18 ENCOUNTER — Encounter (HOSPITAL_COMMUNITY): Payer: Self-pay | Admitting: Emergency Medicine

## 2014-02-18 ENCOUNTER — Emergency Department (HOSPITAL_COMMUNITY): Payer: Medicaid Other

## 2014-02-18 ENCOUNTER — Emergency Department (HOSPITAL_COMMUNITY)
Admission: EM | Admit: 2014-02-18 | Discharge: 2014-02-18 | Disposition: A | Discharge status: Home / Self Care | Payer: Medicaid Other | Attending: Emergency Medicine | Admitting: Emergency Medicine

## 2014-02-18 DIAGNOSIS — F17200 Nicotine dependence, unspecified, uncomplicated: Secondary | ICD-10-CM | POA: Insufficient documentation

## 2014-02-18 DIAGNOSIS — F411 Generalized anxiety disorder: Secondary | ICD-10-CM | POA: Insufficient documentation

## 2014-02-18 DIAGNOSIS — S99929A Unspecified injury of unspecified foot, initial encounter: Principal | ICD-10-CM

## 2014-02-18 DIAGNOSIS — M79604 Pain in right leg: Secondary | ICD-10-CM

## 2014-02-18 DIAGNOSIS — T148XXA Other injury of unspecified body region, initial encounter: Secondary | ICD-10-CM | POA: Insufficient documentation

## 2014-02-18 DIAGNOSIS — S8990XA Unspecified injury of unspecified lower leg, initial encounter: Secondary | ICD-10-CM | POA: Insufficient documentation

## 2014-02-18 DIAGNOSIS — Y9289 Other specified places as the place of occurrence of the external cause: Secondary | ICD-10-CM | POA: Insufficient documentation

## 2014-02-18 DIAGNOSIS — Z8619 Personal history of other infectious and parasitic diseases: Secondary | ICD-10-CM | POA: Insufficient documentation

## 2014-02-18 DIAGNOSIS — R296 Repeated falls: Secondary | ICD-10-CM | POA: Insufficient documentation

## 2014-02-18 DIAGNOSIS — S99919A Unspecified injury of unspecified ankle, initial encounter: Principal | ICD-10-CM

## 2014-02-18 DIAGNOSIS — Y9389 Activity, other specified: Secondary | ICD-10-CM | POA: Insufficient documentation

## 2014-02-18 MED ORDER — ONDANSETRON 8 MG PO TBDP
8.0000 mg | ORAL_TABLET | Freq: Once | ORAL | Status: AC
  Administered 2014-02-18: 8 mg via ORAL
  Filled 2014-02-18: qty 1

## 2014-02-18 MED ORDER — ONDANSETRON HCL 4 MG PO TABS: 4.0000 mg | ORAL_TABLET | Freq: Four times a day (QID) | ORAL | Status: DC

## 2014-02-18 MED ORDER — OXYCODONE-ACETAMINOPHEN 5-325 MG PO TABS: 1.0000 | ORAL_TABLET | Freq: Four times a day (QID) | ORAL | Status: DC | PRN

## 2014-02-18 MED ORDER — OXYCODONE-ACETAMINOPHEN 5-325 MG PO TABS
2.0000 | ORAL_TABLET | Freq: Once | ORAL | Status: AC
  Administered 2014-02-18: 2 via ORAL
  Filled 2014-02-18: qty 2

## 2014-02-18 NOTE — ED Notes (Signed)
Ortho Tech called for crutches application

## 2014-02-18 NOTE — ED Notes (Signed)
Pt fell last night landing on plastic tote on right side, pt c/o pain to right leg.

## 2014-02-18 NOTE — Discharge Instructions (Signed)
Muscle Strain A muscle strain (pulled muscle) happens when a muscle is stretched beyond normal length. It happens when a sudden, violent force stretches your muscle too far. Usually, a few of the fibers in your muscle are torn. Muscle strain is common in athletes. Recovery usually takes 1-2 weeks. Complete healing takes 5-6 weeks.  HOME CARE   Follow the PRICE method of treatment to help your injury get better. Do this the first 2-3 days after the injury:  Protect. Protect the muscle to keep it from getting injured again.  Rest. Limit your activity and rest the injured body part.  Ice. Put ice in a plastic bag. Place a towel between your skin and the bag. Then, apply the ice and leave it on from 15-20 minutes each hour. After the third day, switch to moist heat packs.  Compression. Use a splint or elastic bandage on the injured area for comfort. Do not put it on too tightly.  Elevate. Keep the injured body part above the level of your heart.  Only take medicine as told by your doctor.  Warm up before doing exercise to prevent future muscle strains. GET HELP IF:   You have more pain or puffiness (swelling) in the injured area.  You feel numbness, tingling, or notice a loss of strength in the injured area. MAKE SURE YOU:   Understand these instructions.  Will watch your condition.  Will get help right away if you are not doing well or get worse. Document Released: 05/28/2008 Document Revised: 06/09/2013 Document Reviewed: 03/18/2013 [facility] Patient Information 2015 [place], [place]. This information is not intended to replace advice given to you by your health care provider. Make sure you discuss any questions you have with your health care provider.  Musculoskeletal Pain Musculoskeletal pain is muscle and boney aches and pains. These pains can occur in any part of the body. Your caregiver may treat you without knowing the cause of the pain. They may treat you if blood or urine  tests, X-rays, and other tests were normal.  CAUSES There is often not a definite cause or reason for these pains. These pains may be caused by a type of germ (virus). The discomfort may also come from overuse. Overuse includes working out too hard when your body is not fit. Boney aches also come from weather changes. Bone is sensitive to atmospheric pressure changes. HOME CARE INSTRUCTIONS   Ask when your test results will be ready. Make sure you get your test results.  Only take over-the-counter or prescription medicines for pain, discomfort, or fever as directed by your caregiver. If you were given medications for your condition, do not drive, operate machinery or power tools, or sign legal documents for 24 hours. Do not drink alcohol. Do not take sleeping pills or other medications that may interfere with treatment.  Continue all activities unless the activities cause more pain. When the pain lessens, slowly resume normal activities. Gradually increase the intensity and duration of the activities or exercise.  During periods of severe pain, bed rest may be helpful. Lay or sit in any position that is comfortable.  Putting ice on the injured area.  Put ice in a bag.  Place a towel between your skin and the bag.  Leave the ice on for 15 to 20 minutes, 3 to 4 times a day.  Follow up with your caregiver for continued problems and no reason can be found for the pain. If the pain becomes worse or does not go  away, it may be necessary to repeat tests or do additional testing. Your caregiver may need to look further for a possible cause. SEEK IMMEDIATE MEDICAL CARE IF:  You have pain that is getting worse and is not relieved by medications.  You develop chest pain that is associated with shortness or breath, sweating, feeling sick to your stomach (nauseous), or throw up (vomit).  Your pain becomes localized to the abdomen.  You develop any new symptoms that seem different or that concern  you. MAKE SURE YOU:   Understand these instructions.  Will watch your condition.  Will get help right away if you are not doing well or get worse. Document Released: 08/19/2005 Document Revised: 11/11/2011 Document Reviewed: 04/23/2013 [facility] Patient Information 2015 [place], [place]. This information is not intended to replace advice given to you by your health care provider. Make sure you discuss any questions you have with your health care provider.

## 2014-02-18 NOTE — ED Notes (Signed)
MD at bedside. Edpa hannah present

## 2014-02-18 NOTE — ED Notes (Signed)
Pt taken to Xray.

## 2014-02-18 NOTE — ED Provider Notes (Signed)
CSN: [id number]     Arrival date & time 02/18/14  0809 History   First MD Initiated Contact with Patient 02/18/14 0818     Chief Complaint  Patient presents with  . Leg Pain    right     (Consider location/radiation/quality/duration/timing/severity/associated sxs/prior Treatment) HPI Comments: Patient is a 28 year old female with history of anxiety and chlamydia who presents today with right leg pain. She states that last night she was standing on a plastic tote when it gave way. This caused her to fall and twist her right ankle. Since that time she's been having severe, shooting pain through her right lower leg. She has not injured this leg in the past. She has tried taking a Percocet as well as ibuprofen with no relief of her pain. She last took his medications last night. No other injuries during the fall. She did not lose consciousness come here. She is only having pain in his right leg.  Patient is a 28 y.o. female presenting with leg pain. The history is provided by the patient. No language interpreter was used.  Leg Pain   Past Medical History  Diagnosis Date  . Anxiety     little bit  . Chlamydia    Past Surgical History  Procedure Laterality Date  . Cholecystectomy, laparoscopic     Family History  Problem Relation Age of Onset  . Anesthesia problems Neg Hx   . Hypotension Neg Hx   . Malignant hyperthermia Neg Hx   . Pseudochol deficiency Neg Hx   . Hypertension Mother   . Diabetes Mother    History  Substance Use Topics  . Smoking status: Current Every Day Smoker    Types: Cigarettes  . Smokeless tobacco: Never Used     Comment: quit 08/12  . Alcohol Use: Yes   OB History   Grav Para Term Preterm Abortions TAB SAB Ect Mult Living   2 2 2  0 0 0 0 0 0 2     Review of Systems  Respiratory: Negative for shortness of breath.   Cardiovascular: Negative for chest pain.  Musculoskeletal: Positive for arthralgias and myalgias.  Neurological: Negative for syncope.   All other systems reviewed and are negative.     Allergies  Review of patient's allergies indicates no known allergies.  Home Medications   Prior to Admission medications   Medication Sig Start Date End Date Taking? Authorizing Provider  acetaminophen (TYLENOL) 500 MG tablet Take 1,000 mg by mouth every 6 (six) hours as needed. For pain    Historical Provider, MD  diazepam (VALIUM) 5 MG tablet Take 0.5 tablets (2.5 mg total) by mouth every 6 (six) hours as needed for muscle spasms. 09/04/13   Virgel Manifold, MD  ibuprofen (ADVIL,MOTRIN) 200 MG tablet Take 400 mg by mouth every 6 (six) hours as needed for headache or mild pain.    Historical Provider, MD  naproxen (NAPROSYN) 500 MG tablet Take 1 tablet (500 mg total) by mouth 2 (two) times daily as needed. 09/04/13   Virgel Manifold, MD  oxyCODONE-acetaminophen (PERCOCET/ROXICET) 5-325 MG per tablet Take 1 tablet by mouth every 4 (four) hours as needed for severe pain. 09/04/13   Virgel Manifold, MD   BP 110/74  Pulse 61  Temp(Src) 98.2 F (36.8 C) (Oral)  Resp 16  SpO2 100%  LMP 02/07/2014 Physical Exam  Nursing note and vitals reviewed. Constitutional: She is oriented to person, place, and time. She appears well-developed and well-nourished. No distress.  HENT:  Head: Normocephalic and atraumatic.  Right Ear: External ear normal.  Left Ear: External ear normal.  Nose: Nose normal.  Mouth/Throat: Oropharynx is clear and moist.  Eyes: Conjunctivae are normal.  Neck: Normal range of motion.  Cardiovascular: Normal rate, regular rhythm, normal heart sounds, intact distal pulses and normal pulses.   Pulses:      Dorsalis pedis pulses are 2+ on the right side, and 2+ on the left side.       Posterior tibial pulses are 2+ on the right side, and 2+ on the left side.  Pulmonary/Chest: Effort normal and breath sounds normal. No stridor. No respiratory distress. She has no wheezes. She has no rales.  Abdominal: Soft. She exhibits no distension.   Musculoskeletal: Normal range of motion.  Right lower leg is exquisitely tender to palpation. There is diffusely over the leg. There is no swelling, erythema, bruising. Compartments are soft. Neurovascularly intact.  Neurological: She is alert and oriented to person, place, and time. She has normal strength.  Skin: Skin is warm and dry. She is not diaphoretic. No erythema.  Psychiatric: She has a normal mood and affect. Her behavior is normal.    ED Course  Procedures (including critical care time) Labs Review Labs Reviewed - No data to display  Imaging Review Dg Tibia/fibula Right  02/18/2014   CLINICAL DATA:  Status post fall with medial right calf pain  EXAM: RIGHT TIBIA AND FIBULA - 2 VIEW  COMPARISON:  None.  FINDINGS: The right tibia and fibula are adequately mineralized. There is no acute fracture. The distal portion of the tibia and fibula are excluded but are included on accompanying ankle series. The observed portions of the knee are normal.  IMPRESSION: There is no acute bony abnormality of the right tibia or fibula. The overlying soft tissues are unremarkable.   Electronically Signed   By: David  Martinique   On: 02/18/2014 08:54   Dg Ankle Complete Right  02/18/2014   CLINICAL DATA:  Fall.  EXAM: RIGHT ANKLE - COMPLETE 3+ VIEW  COMPARISON:  None.  FINDINGS: There is no evidence of fracture, dislocation, or joint effusion. There is no evidence of arthropathy or other focal bone abnormality. Soft tissues are unremarkable.  IMPRESSION: Negative.   Electronically Signed   By: Marcello Moores  Register   On: 02/18/2014 09:11     EKG Interpretation None      MDM   Final diagnoses:  Muscle strain  Right leg pain   Patient presents with right lower leg pain after a fall last night. No other injuries. X-rays show no fracture. Patient is diffusely tender over right lower leg. She was given crutches for comfort. She is encouraged to rest, ice, elevate. She is neurovascularly intact her  compartments are soft. Discussed reasons to return to the emergency department immediately. Vital signs stable for discharge. Patient / Family / Caregiver informed of clinical course, understand medical decision-making process, and agree with plan.    Elwyn Lade, PA-C 02/18/14 1046

## 2014-02-18 NOTE — ED Notes (Signed)
Family at bedside. 

## 2014-02-21 NOTE — ED Provider Notes (Signed)
Medical screening examination/treatment/procedure(s) were performed by non-physician practitioner and as supervising physician I was immediately available for consultation/collaboration.   EKG Interpretation None       Virgel Manifold, MD 02/21/14 1258

## 2014-02-23 ENCOUNTER — Encounter (HOSPITAL_COMMUNITY): Payer: Self-pay | Admitting: Emergency Medicine

## 2014-02-23 ENCOUNTER — Emergency Department (HOSPITAL_COMMUNITY)
Admission: EM | Admit: 2014-02-23 | Discharge: 2014-02-24 | Disposition: A | Discharge status: Home / Self Care | Payer: Medicaid Other | Attending: Emergency Medicine | Admitting: Emergency Medicine

## 2014-02-23 DIAGNOSIS — Y939 Activity, unspecified: Secondary | ICD-10-CM | POA: Insufficient documentation

## 2014-02-23 DIAGNOSIS — Z8619 Personal history of other infectious and parasitic diseases: Secondary | ICD-10-CM | POA: Insufficient documentation

## 2014-02-23 DIAGNOSIS — R296 Repeated falls: Secondary | ICD-10-CM | POA: Insufficient documentation

## 2014-02-23 DIAGNOSIS — Z8659 Personal history of other mental and behavioral disorders: Secondary | ICD-10-CM | POA: Insufficient documentation

## 2014-02-23 DIAGNOSIS — IMO0002 Reserved for concepts with insufficient information to code with codable children: Secondary | ICD-10-CM | POA: Insufficient documentation

## 2014-02-23 DIAGNOSIS — S86111A Strain of other muscle(s) and tendon(s) of posterior muscle group at lower leg level, right leg, initial encounter: Secondary | ICD-10-CM

## 2014-02-23 DIAGNOSIS — F17200 Nicotine dependence, unspecified, uncomplicated: Secondary | ICD-10-CM | POA: Insufficient documentation

## 2014-02-23 DIAGNOSIS — Y929 Unspecified place or not applicable: Secondary | ICD-10-CM | POA: Insufficient documentation

## 2014-02-23 NOTE — ED Notes (Signed)
Pt states she fell on Thursday and came in on Friday  Pt was told she had a sprain to her right calf  Pt was put on crutches and told to use ibuprofen and used ice and elevation  Pt states she continues to have pain and swelling in her leg and states it hurts to walk

## 2014-02-24 MED ORDER — TRAMADOL HCL 50 MG PO TABS: 50.0000 mg | ORAL_TABLET | Freq: Four times a day (QID) | ORAL | Status: DC | PRN

## 2014-02-24 MED ORDER — TRAMADOL HCL 50 MG PO TABS
50.0000 mg | ORAL_TABLET | Freq: Once | ORAL | Status: AC
  Administered 2014-02-24: 50 mg via ORAL
  Filled 2014-02-24: qty 1

## 2014-02-24 NOTE — Discharge Instructions (Signed)
Your providers today feel you are continued pain is caused from a muscle tear. Continue to do gentle stretching of your leg and ankle. Use warm compresses for 20-30 minutes. You may also alternate ice for 20-30 minutes. Followup with a primary care provider or orthopedic specialist for continued evaluation and treatment.    Muscle Tear A muscle tear is usually caused by over-exertion or stretching. The muscle often takes a while to heal. Muscle tears require 3 to 4 weeks to heal completely. A history of the injury and a physical exam may be performed. Sometimes, the injury is identified with radiographs and an MRI. Treatment for muscle injuries includes:  Resting and protecting the affected area until pain with motion is gone.  Putting ice on the injured area.  Put ice in a bag.  Place a towel between your skin and the bag.  Leave the ice on for 15 to 20 minutes, 3 to 4 times a day.  After two days, you can use heat to relieve spasms.  Using compression wraps to help control swelling and limit movement.  Raising (elevate) the injured area above the level of the heart (if possible) for the first 1 to 2 days after the injury.  Medicine may be prescribed to reduce pain and inflammation. Avoid strenuous activities that bring on muscle pain. Exercises to strengthen and stretch the injured muscle can help heal the muscle and prevent repeated injury. After the pain and swelling are gone, you may begin gradual strengthening exercises. Begin range-of-motion exercises and gentle stretching after 3 to 4 days of rest.  SEEK MEDICAL CARE IF:  Your injured muscle is not improving after 1 week of treatment. Document Released: 09/26/2004 Document Revised: 11/11/2011 Document Reviewed: 03/03/2009 [facility] Patient Information 2015 [place], [place]. This information is not intended to replace advice given to you by your health care provider. Make sure you discuss any questions you have with your health care  provider.

## 2014-02-24 NOTE — ED Notes (Signed)
PA Dammen at bedside. 

## 2014-02-24 NOTE — ED Provider Notes (Signed)
CSN: [id number]     Arrival date & time 02/23/14  2208 History   First MD Initiated Contact with Patient 02/24/14 0017     Chief Complaint  Patient presents with  . Fall   HPI  History provided by the patient and recent medical chart. Patient is a 28 year old female who presents with complaints of continued right lower leg pain. Patient reports falling from a plastic box several days ago. She was seen and evaluated in the emergency department on June 19. She had a negative x-rays of her lower leg and ankle without fractures or dislocations. She was given crutches to rest for the first 3 days and then instructed to continue to use ice and elevation. She has also been taking ibuprofen regularly but states that she continues to have pain and soreness in the calf muscle of her leg whenever she walks. She denies any weakness or numbness in the foot. She denies any worsened swelling. Denies any chest pain or shortness of breath. No fever, chills or sweats.   Past Medical History  Diagnosis Date  . Anxiety     little bit  . Chlamydia    Past Surgical History  Procedure Laterality Date  . Cholecystectomy, laparoscopic    . Cholecystectomy     Family History  Problem Relation Age of Onset  . Anesthesia problems Neg Hx   . Hypotension Neg Hx   . Malignant hyperthermia Neg Hx   . Pseudochol deficiency Neg Hx   . Hypertension Mother   . Diabetes Mother    History  Substance Use Topics  . Smoking status: Current Every Day Smoker    Types: Cigarettes  . Smokeless tobacco: Never Used     Comment: quit 08/12  . Alcohol Use: Yes     Comment: occ   OB History   Grav Para Term Preterm Abortions TAB SAB Ect Mult Living   2 2 2  0 0 0 0 0 0 2     Review of Systems  Respiratory: Negative for shortness of breath.   Cardiovascular: Negative for chest pain.  All other systems reviewed and are negative.     Allergies  Review of patient's allergies indicates no known allergies.  Home  Medications   Prior to Admission medications   Medication Sig Start Date End Date Taking? Authorizing Provider  acetaminophen (TYLENOL) 500 MG tablet Take 1,000 mg by mouth every 6 (six) hours as needed. For pain    Historical Provider, MD  ondansetron (ZOFRAN) 4 MG tablet Take 1 tablet (4 mg total) by mouth every 6 (six) hours. 02/18/14   Elwyn Lade, PA-C  oxyCODONE-acetaminophen (PERCOCET/ROXICET) 5-325 MG per tablet Take 1 tablet by mouth every 4 (four) hours as needed for severe pain.    Historical Provider, MD  oxyCODONE-acetaminophen (PERCOCET/ROXICET) 5-325 MG per tablet Take 1-2 tablets by mouth every 6 (six) hours as needed for moderate pain or severe pain. 02/18/14   Elwyn Lade, PA-C   BP 132/55  Pulse 79  Temp(Src) 97.8 F (36.6 C) (Oral)  Resp 18  Ht 5\' 6"  (1.676 m)  Wt 252 lb (114.306 kg)  BMI 40.69 kg/m2  SpO2 100%  LMP 02/07/2014 Physical Exam  Nursing note and vitals reviewed. Constitutional: She is oriented to person, place, and time. She appears well-developed and well-nourished. No distress.  HENT:  Head: Normocephalic.  Cardiovascular: Normal rate and regular rhythm.   Pulmonary/Chest: Effort normal and breath sounds normal. No respiratory distress.  Musculoskeletal: She exhibits  edema and tenderness.  There is mild edema and significant tenderness around the right gastrocnemius. There is also bruising to the skin. Normal Thompson test. Normal nontender palpation of the achillies tendon. Normal dorsal pedal pulses and sensation in the foot.  Neurological: She is alert and oriented to person, place, and time.  Skin: Skin is warm and dry. No rash noted.  Psychiatric: She has a normal mood and affect. Her behavior is normal.    ED Course  Procedures   COORDINATION OF CARE:  Nursing notes reviewed. Vital signs reviewed. Initial pt interview and examination performed.   Filed Vitals:   02/23/14 2224  BP: 132/55  Pulse: 79  Temp: 97.8 F (36.6 C)   TempSrc: Oral  Resp: 18  Height: 5\' 6"  (1.676 m)  Weight: 252 lb (114.306 kg)  SpO2: 100%    12:49 AM-patient seen and evaluated. She has tenderness throughout her right gastrocnemius muscle area. There is no significant bulge or mass. Normal Thompson test. Normal palpable achillies tendon. No tenderness at the achillies tendon. There is bruising around the gastrocnemius consistent with muscle tear. Discussed diagnosis and treatment plan with patient. Will provide Ace wrap.       MDM   Final diagnoses:  Gastrocnemius muscle tear, right, initial encounter        Martie Lee, PA-C 02/24/14 [phone]

## 2014-02-24 NOTE — ED Provider Notes (Signed)
Medical screening examination/treatment/procedure(s) were performed by non-physician practitioner and as supervising physician I was immediately available for consultation/collaboration.   EKG Interpretation None       Threasa Beards, MD 02/24/14 [phone]

## 2014-07-04 ENCOUNTER — Encounter (HOSPITAL_COMMUNITY): Payer: Self-pay | Admitting: Emergency Medicine

## 2014-09-14 ENCOUNTER — Inpatient Hospital Stay (HOSPITAL_COMMUNITY)
Admission: AD | Admit: 2014-09-14 | Discharge: 2014-09-14 | Disposition: A | Discharge status: Home / Self Care | Payer: Medicaid Other | Source: Ambulatory Visit | Attending: Obstetrics | Admitting: Obstetrics

## 2014-09-14 ENCOUNTER — Encounter (HOSPITAL_COMMUNITY): Payer: Self-pay | Admitting: *Deleted

## 2014-09-14 DIAGNOSIS — Z87891 Personal history of nicotine dependence: Secondary | ICD-10-CM | POA: Insufficient documentation

## 2014-09-14 DIAGNOSIS — N390 Urinary tract infection, site not specified: Secondary | ICD-10-CM | POA: Insufficient documentation

## 2014-09-14 DIAGNOSIS — N946 Dysmenorrhea, unspecified: Secondary | ICD-10-CM | POA: Insufficient documentation

## 2014-09-14 LAB — URINALYSIS, ROUTINE W REFLEX MICROSCOPIC
BILIRUBIN URINE: NEGATIVE
GLUCOSE, UA: NEGATIVE mg/dL
KETONES UR: NEGATIVE mg/dL
Nitrite: NEGATIVE
PROTEIN: NEGATIVE mg/dL
Specific Gravity, Urine: >1.03 — ABNORMAL HIGH (ref 1.005–1.030)
Urobilinogen, UA: 0.2 mg/dL (ref 0.0–1.0)
pH: 6 (ref 5.0–8.0)

## 2014-09-14 LAB — URINE MICROSCOPIC-ADD ON

## 2014-09-14 LAB — WET PREP, GENITAL
Trich, Wet Prep: NONE SEEN
Yeast Wet Prep HPF POC: NONE SEEN

## 2014-09-14 LAB — POCT PREGNANCY, URINE: Preg Test, Ur: NEGATIVE

## 2014-09-14 MED ORDER — CEPHALEXIN 500 MG PO CAPS: 500.0000 mg | ORAL_CAPSULE | Freq: Four times a day (QID) | ORAL | Status: DC

## 2014-09-14 MED ORDER — NORGESTIMATE-ETH ESTRADIOL 0.25-35 MG-MCG PO TABS: 1.0000 | ORAL_TABLET | Freq: Every day | ORAL | Status: DC

## 2014-09-14 MED ORDER — KETOROLAC TROMETHAMINE 60 MG/2ML IM SOLN
60.0000 mg | Freq: Once | INTRAMUSCULAR | Status: AC
  Administered 2014-09-14: 60 mg via INTRAMUSCULAR
  Filled 2014-09-14: qty 2

## 2014-09-14 NOTE — MAU Note (Signed)
Was spotting 2-3 weeks ago, then started period on 12/29 - regular period.  Lower abd cramping has continued after period.  States she has been having very wet underwear but she doesn't think she urinated.  Is sometimes clear, sometimes white, no odor.

## 2014-09-14 NOTE — Discharge Instructions (Signed)
Dysmenorrhea °Menstrual cramps (dysmenorrhea) are caused by the muscles of the uterus tightening (contracting) during a menstrual period. For some women, this discomfort is merely bothersome. For others, dysmenorrhea can be severe enough to interfere with everyday activities for a few days each month. °Primary dysmenorrhea is menstrual cramps that last a couple of days when you start having menstrual periods or soon after. This often begins after a teenager starts having her period. As a woman gets older or has a baby, the cramps will usually lessen or disappear. Secondary dysmenorrhea begins later in life, lasts longer, and the pain may be stronger than primary dysmenorrhea. The pain may start before the period and last a few days after the period.  °CAUSES  °Dysmenorrhea is usually caused by an underlying problem, such as: °· The tissue lining the uterus grows outside of the uterus in other areas of the body (endometriosis). °· The endometrial tissue, which normally lines the uterus, is found in or grows into the muscular walls of the uterus (adenomyosis). °· The pelvic blood vessels are engorged with blood just before the menstrual period (pelvic congestive syndrome). °· Overgrowth of cells (polyps) in the lining of the uterus or cervix. °· Falling down of the uterus (prolapse) because of loose or stretched ligaments. °· Depression. °· Bladder problems, infection, or inflammation. °· Problems with the intestine, a tumor, or irritable bowel syndrome. °· Cancer of the female organs or bladder. °· A severely tipped uterus. °· A very tight opening or closed cervix. °· Noncancerous tumors of the uterus (fibroids). °· Pelvic inflammatory disease (PID). °· Pelvic scarring (adhesions) from a previous surgery. °· Ovarian cyst. °· An intrauterine device (IUD) used for birth control. °RISK FACTORS °You may be at greater risk of dysmenorrhea if: °· You are younger than age 30. °· You started puberty early. °· You have  irregular or heavy bleeding. °· You have never given birth. °· You have a family history of this problem. °· You are a smoker. °SIGNS AND SYMPTOMS  °· Cramping or throbbing pain in your lower abdomen. °· Headaches. °· Lower back pain. °· Nausea or vomiting. °· Diarrhea. °· Sweating or dizziness. °· Loose stools. °DIAGNOSIS  °A diagnosis is based on your history, symptoms, physical exam, diagnostic tests, or procedures. Diagnostic tests or procedures may include: °· Blood tests. °· Ultrasonography. °· An examination of the lining of the uterus (dilation and curettage, D&C). °· An examination inside your abdomen or pelvis with a scope (laparoscopy). °· X-rays. °· CT scan. °· MRI. °· An examination inside the bladder with a scope (cystoscopy). °· An examination inside the intestine or stomach with a scope (colonoscopy, gastroscopy). °TREATMENT  °Treatment depends on the cause of the dysmenorrhea. Treatment may include: °· Pain medicine prescribed by your health care provider. °· Birth control pills or an IUD with progesterone hormone in it. °· Hormone replacement therapy. °· Nonsteroidal anti-inflammatory drugs (NSAIDs). These may help stop the production of prostaglandins. °· Surgery to remove adhesions, endometriosis, ovarian cyst, or fibroids. °· Removal of the uterus (hysterectomy). °· Progesterone shots to stop the menstrual period. °· Cutting the nerves on the sacrum that go to the female organs (presacral neurectomy). °· Electric current to the sacral nerves (sacral nerve stimulation). °· Antidepressant medicine. °· Psychiatric therapy, counseling, or group therapy. °· Exercise and physical therapy. °· Meditation and yoga therapy. °· Acupuncture. °HOME CARE INSTRUCTIONS  °· Only take over-the-counter or prescription medicines as directed by your health care provider. °· Place a heating pad   or hot water bottle on your lower back or abdomen. Do not sleep with the heating pad.  Use aerobic exercises, walking,  swimming, biking, and other exercises to help lessen the cramping.  Massage to the lower back or abdomen may help.  Stop smoking.  Avoid alcohol and caffeine. SEEK MEDICAL CARE IF:   Your pain does not get better with medicine.  You have pain with sexual intercourse.  Your pain increases and is not controlled with medicines.  You have abnormal vaginal bleeding with your period.  You develop nausea or vomiting with your period that is not controlled with medicine. SEEK IMMEDIATE MEDICAL CARE IF:  You pass out.  Document Released: 08/19/2005 Document Revised: 04/21/2013 Document Reviewed: 02/04/2013 [facility] Patient Information 2015 [place], [place]. This information is not intended to replace advice given to you by your health care provider. Make sure you discuss any questions you have with your health care provider. Urinary Tract Infection Urinary tract infections (UTIs) can develop anywhere along your urinary tract. Your urinary tract is your body's drainage system for removing wastes and extra water. Your urinary tract includes two kidneys, two ureters, a bladder, and a urethra. Your kidneys are a pair of bean-shaped organs. Each kidney is about the size of your fist. They are located below your ribs, one on each side of your spine. CAUSES Infections are caused by microbes, which are microscopic organisms, including fungi, viruses, and bacteria. These organisms are so small that they can only be seen through a microscope. Bacteria are the microbes that most commonly cause UTIs. SYMPTOMS  Symptoms of UTIs may vary by age and gender of the patient and by the location of the infection. Symptoms in young women typically include a frequent and intense urge to urinate and a painful, burning feeling in the bladder or urethra during urination. Older women and men are more likely to be tired, shaky, and weak and have muscle aches and abdominal pain. A fever may mean the infection is in your  kidneys. Other symptoms of a kidney infection include pain in your back or sides below the ribs, nausea, and vomiting. DIAGNOSIS To diagnose a UTI, your caregiver will ask you about your symptoms. Your caregiver also will ask to provide a urine sample. The urine sample will be tested for bacteria and white blood cells. White blood cells are made by your body to help fight infection. TREATMENT  Typically, UTIs can be treated with medication. Because most UTIs are caused by a bacterial infection, they usually can be treated with the use of antibiotics. The choice of antibiotic and length of treatment depend on your symptoms and the type of bacteria causing your infection. HOME CARE INSTRUCTIONS  If you were prescribed antibiotics, take them exactly as your caregiver instructs you. Finish the medication even if you feel better after you have only taken some of the medication.  Drink enough water and fluids to keep your urine clear or pale yellow.  Avoid caffeine, tea, and carbonated beverages. They tend to irritate your bladder.  Empty your bladder often. Avoid holding urine for long periods of time.  Empty your bladder before and after sexual intercourse.  After a bowel movement, women should cleanse from front to back. Use each tissue only once. SEEK MEDICAL CARE IF:   You have back pain.  You develop a fever.  Your symptoms do not begin to resolve within 3 days. SEEK IMMEDIATE MEDICAL CARE IF:   You have severe back pain or lower  abdominal pain.  You develop chills.  You have nausea or vomiting.  You have continued burning or discomfort with urination. MAKE SURE YOU:   Understand these instructions.  Will watch your condition.  Will get help right away if you are not doing well or get worse. Document Released: 05/29/2005 Document Revised: 02/18/2012 Document Reviewed: 09/27/2011 [facility] Patient Information 2015 [place], [place]. This information is not intended to replace  advice given to you by your health care provider. Make sure you discuss any questions you have with your health care provider.

## 2014-09-14 NOTE — MAU Provider Note (Signed)
History     CSN: [id number]  Arrival date and time: 09/14/14 8251   First Provider Initiated Contact with Patient 09/14/14 [phone]      Chief Complaint  Patient presents with  . Abdominal Pain   HPI Comments: SEHAJ KOLDEN 29 y.o. [id number] presents to MAU with low pelvic pains that are very intermittent and ongoing for several months. No fevers. She is also having irregular menses and is on no birth control. She uses condom most of the time and has the same partner. She is complaining of a vaginal discharge. She called Dr Alton Revere office but could not get in today. Last intercourse was 2 weeks ago  Abdominal Pain      Past Medical History  Diagnosis Date  . Anxiety     little bit  . Chlamydia     Past Surgical History  Procedure Laterality Date  . Cholecystectomy, laparoscopic    . Cholecystectomy    . Teeth pulled      Family History  Problem Relation Age of Onset  . Anesthesia problems Neg Hx   . Hypotension Neg Hx   . Malignant hyperthermia Neg Hx   . Pseudochol deficiency Neg Hx   . Hypertension Mother   . Diabetes Mother     History  Substance Use Topics  . Smoking status: Former Smoker -- 0.25 packs/day    Types: Cigarettes  . Smokeless tobacco: Never Used     Comment: quit 08/12  . Alcohol Use: Yes     Comment: occ    Allergies: No Known Allergies  Prescriptions prior to admission  Medication Sig Dispense Refill Last Dose  . acetaminophen (TYLENOL) 500 MG tablet Take 1,000 mg by mouth every 6 (six) hours as needed. For pain   Past Week at Unknown time  . ondansetron (ZOFRAN) 4 MG tablet Take 1 tablet (4 mg total) by mouth every 6 (six) hours. (Patient not taking: Reported on 09/14/2014) 12 tablet 0   . oxyCODONE-acetaminophen (PERCOCET/ROXICET) 5-325 MG per tablet Take 1-2 tablets by mouth every 6 (six) hours as needed for moderate pain or severe pain. (Patient not taking: Reported on 09/14/2014) 10 tablet 0   . traMADol (ULTRAM) 50 MG tablet Take  1 tablet (50 mg total) by mouth every 6 (six) hours as needed. (Patient not taking: Reported on 09/14/2014) 15 tablet 0     Review of Systems  Constitutional: Negative.   HENT: Negative.   Respiratory: Negative.   Cardiovascular: Negative.   Gastrointestinal: Positive for abdominal pain.  Genitourinary:       Vaginal discharge Irregular menses  Musculoskeletal: Negative.   Skin: Negative.   Neurological: Negative.   Psychiatric/Behavioral: Negative.    Physical Exam   Blood pressure 124/77, pulse 83, temperature 98.2 F (36.8 C), temperature source Oral, resp. rate 20, height 5\' 7"  (1.702 m), weight 113.671 kg (250 lb 9.6 oz), last menstrual period 08/29/2014.  Physical Exam  Constitutional: She is oriented to person, place, and time. She appears well-developed and well-nourished. No distress.  Obese  HENT:  Head: Normocephalic and atraumatic.  GI: Soft. She exhibits no distension and no mass. There is tenderness. There is no rebound and no guarding.  Genitourinary:  Genital:external negative Vaginal:small amount white discharge Cervix: mild CMT  Bimanual: no mass appreciated   Musculoskeletal: Normal range of motion.  Neurological: She is alert and oriented to person, place, and time.  Skin: Skin is warm and dry.  Psychiatric: She has a normal mood and affect.  Her behavior is normal. Judgment and thought content normal.   Results for orders placed or performed during the hospital encounter of 09/14/14 (from the past 24 hour(s))  Urinalysis, Routine w reflex microscopic     Status: Abnormal   Collection Time: 09/14/14  8:40 AM  Result Value Ref Range   Color, Urine YELLOW YELLOW   APPearance CLOUDY (A) CLEAR   Specific Gravity, Urine >1.030 (H) 1.005 - 1.030   pH 6.0 5.0 - 8.0   Glucose, UA NEGATIVE NEGATIVE mg/dL   Hgb urine dipstick MODERATE (A) NEGATIVE   Bilirubin Urine NEGATIVE NEGATIVE   Ketones, ur NEGATIVE NEGATIVE mg/dL   Protein, ur NEGATIVE NEGATIVE mg/dL    Urobilinogen, UA 0.2 0.0 - 1.0 mg/dL   Nitrite NEGATIVE NEGATIVE   Leukocytes, UA LARGE (A) NEGATIVE  Urine microscopic-add on     Status: Abnormal   Collection Time: 09/14/14  8:40 AM  Result Value Ref Range   Squamous Epithelial / LPF MANY (A) RARE   WBC, UA 21-50 <3 WBC/hpf   Bacteria, UA MANY (A) RARE  Pregnancy, urine POC     Status: None   Collection Time: 09/14/14  8:48 AM  Result Value Ref Range   Preg Test, Ur NEGATIVE NEGATIVE  Wet prep, genital     Status: Abnormal   Collection Time: 09/14/14  9:26 AM  Result Value Ref Range   Yeast Wet Prep HPF POC NONE SEEN NONE SEEN   Trich, Wet Prep NONE SEEN NONE SEEN   Clue Cells Wet Prep HPF POC FEW (A) NONE SEEN   WBC, Wet Prep HPF POC MANY (A) NONE SEEN   . MAU Course  Procedures  MDM Urine culture Toradol 60 mg IM GC/Chlamydia/wet prep  Assessment and Plan   A: UTI Dysmenorrhea  P: Keflex 500 mg po QID x 7days Orthocyclen BCP / quick start today Increase fluids Follow up with Dr Ruthann Cancer Return to MAU as needed  Georgia Duff 09/14/2014, 10:27 AM

## 2014-09-15 LAB — HIV ANTIBODY (ROUTINE TESTING W REFLEX)
HIV 1/HIV 2 AB: NONREACTIVE
HIV 1/O/2 Abs-Index Value: <1 (ref <1.00)

## 2014-09-16 LAB — GC/CHLAMYDIA PROBE AMP (~~LOC~~) NOT AT ARMC
Chlamydia: NEGATIVE
NEISSERIA GONORRHEA: NEGATIVE

## 2014-09-27 ENCOUNTER — Encounter (HOSPITAL_COMMUNITY): Payer: Self-pay | Admitting: Emergency Medicine

## 2014-09-27 ENCOUNTER — Emergency Department (HOSPITAL_COMMUNITY)
Admission: EM | Admit: 2014-09-27 | Discharge: 2014-09-27 | Disposition: A | Discharge status: Home / Self Care | Payer: Self-pay | Attending: Emergency Medicine | Admitting: Emergency Medicine

## 2014-09-27 DIAGNOSIS — Z792 Long term (current) use of antibiotics: Secondary | ICD-10-CM | POA: Insufficient documentation

## 2014-09-27 DIAGNOSIS — M722 Plantar fascial fibromatosis: Secondary | ICD-10-CM | POA: Insufficient documentation

## 2014-09-27 DIAGNOSIS — Z87891 Personal history of nicotine dependence: Secondary | ICD-10-CM | POA: Insufficient documentation

## 2014-09-27 DIAGNOSIS — Z8659 Personal history of other mental and behavioral disorders: Secondary | ICD-10-CM | POA: Insufficient documentation

## 2014-09-27 DIAGNOSIS — Z79899 Other long term (current) drug therapy: Secondary | ICD-10-CM | POA: Insufficient documentation

## 2014-09-27 DIAGNOSIS — B373 Candidiasis of vulva and vagina: Secondary | ICD-10-CM | POA: Insufficient documentation

## 2014-09-27 DIAGNOSIS — B3731 Acute candidiasis of vulva and vagina: Secondary | ICD-10-CM

## 2014-09-27 DIAGNOSIS — R599 Enlarged lymph nodes, unspecified: Secondary | ICD-10-CM | POA: Insufficient documentation

## 2014-09-27 MED ORDER — FLUCONAZOLE 150 MG PO TABS
150.0000 mg | ORAL_TABLET | Freq: Every day | ORAL | Status: DC
  Administered 2014-09-27: 150 mg via ORAL
  Filled 2014-09-27: qty 1

## 2014-09-27 MED ORDER — NAPROXEN 500 MG PO TABS: 500.0000 mg | ORAL_TABLET | Freq: Two times a day (BID) | ORAL | Status: DC

## 2014-09-27 MED ORDER — KETOROLAC TROMETHAMINE 60 MG/2ML IM SOLN
60.0000 mg | Freq: Once | INTRAMUSCULAR | Status: AC
  Administered 2014-09-27: 60 mg via INTRAMUSCULAR
  Filled 2014-09-27: qty 2

## 2014-09-27 NOTE — Discharge Instructions (Signed)
Plantar Fasciitis (Heel Spur Syndrome) with Rehab The plantar fascia is a fibrous, ligament-like, soft-tissue structure that spans the bottom of the foot. Plantar fasciitis is a condition that causes pain in the foot due to inflammation of the tissue. SYMPTOMS   Pain and tenderness on the underneath side of the foot.  Pain that worsens with standing or walking. CAUSES  Plantar fasciitis is caused by irritation and injury to the plantar fascia on the underneath side of the foot. Common mechanisms of injury include:  Direct trauma to bottom of the foot.  Damage to a small nerve that runs under the foot where the main fascia attaches to the heel bone.  Stress placed on the plantar fascia due to bone spurs. RISK INCREASES WITH:   Activities that place stress on the plantar fascia (running, jumping, pivoting, or cutting).  Poor strength and flexibility.  Improperly fitted shoes.  Tight calf muscles.  Flat feet.  Failure to warm-up properly before activity.  Obesity. PREVENTION  Warm up and stretch properly before activity.  Allow for adequate recovery between workouts.  Maintain physical fitness:  Strength, flexibility, and endurance.  Cardiovascular fitness.  Maintain a health body weight.  Avoid stress on the plantar fascia.  Wear properly fitted shoes, including arch supports for individuals who have flat feet. PROGNOSIS  If treated properly, then the symptoms of plantar fasciitis usually resolve without surgery. However, occasionally surgery is necessary. RELATED COMPLICATIONS   Recurrent symptoms that may result in a chronic condition.  Problems of the lower back that are caused by compensating for the injury, such as limping.  Pain or weakness of the foot during push-off following surgery.  Chronic inflammation, scarring, and partial or complete fascia tear, occurring more often from repeated injections. TREATMENT  Treatment initially involves the use of  ice and medication to help reduce pain and inflammation. The use of strengthening and stretching exercises may help reduce pain with activity, especially stretches of the Achilles tendon. These exercises may be performed at home or with a therapist. Your caregiver may recommend that you use heel cups of arch supports to help reduce stress on the plantar fascia. Occasionally, corticosteroid injections are given to reduce inflammation. If symptoms persist for greater than 6 months despite non-surgical (conservative), then surgery may be recommended.  MEDICATION   If pain medication is necessary, then nonsteroidal anti-inflammatory medications, such as aspirin and ibuprofen, or other minor pain relievers, such as acetaminophen, are often recommended.  Do not take pain medication within 7 days before surgery.  Prescription pain relievers may be given if deemed necessary by your caregiver. Use only as directed and only as much as you need.  Corticosteroid injections may be given by your caregiver. These injections should be reserved for the most serious cases, because they may only be given a certain number of times. HEAT AND COLD  Cold treatment (icing) relieves pain and reduces inflammation. Cold treatment should be applied for 10 to 15 minutes every 2 to 3 hours for inflammation and pain and immediately after any activity that aggravates your symptoms. Use ice packs or massage the area with a piece of ice (ice massage).  Heat treatment may be used prior to performing the stretching and strengthening activities prescribed by your caregiver, physical therapist, or athletic trainer. Use a heat pack or soak the injury in warm water. SEEK IMMEDIATE MEDICAL CARE IF:  Treatment seems to offer no benefit, or the condition worsens.  Any medications produce adverse side effects. EXERCISES RANGE  OF MOTION (ROM) AND STRETCHING EXERCISES - Plantar Fasciitis (Heel Spur Syndrome) These exercises may help you  when beginning to rehabilitate your injury. Your symptoms may resolve with or without further involvement from your physician, physical therapist or athletic trainer. While completing these exercises, remember:   Restoring tissue flexibility helps normal motion to return to the joints. This allows healthier, less painful movement and activity.  An effective stretch should be held for at least 30 seconds.  A stretch should never be painful. You should only feel a gentle lengthening or release in the stretched tissue. RANGE OF MOTION - Toe Extension, Flexion  Sit with your right / left leg crossed over your opposite knee.  Grasp your toes and gently pull them back toward the top of your foot. You should feel a stretch on the bottom of your toes and/or foot.  Hold this stretch for __________ seconds.  Now, gently pull your toes toward the bottom of your foot. You should feel a stretch on the top of your toes and or foot.  Hold this stretch for __________ seconds. Repeat __________ times. Complete this stretch __________ times per day.  RANGE OF MOTION - Ankle Dorsiflexion, Active Assisted  Remove shoes and sit on a chair that is preferably not on a carpeted surface.  Place right / left foot under knee. Extend your opposite leg for support.  Keeping your heel down, slide your right / left foot back toward the chair until you feel a stretch at your ankle or calf. If you do not feel a stretch, slide your bottom forward to the edge of the chair, while still keeping your heel down.  Hold this stretch for __________ seconds. Repeat __________ times. Complete this stretch __________ times per day.  STRETCH - Gastroc, Standing  Place hands on wall.  Extend right / left leg, keeping the front knee somewhat bent.  Slightly point your toes inward on your back foot.  Keeping your right / left heel on the floor and your knee straight, shift your weight toward the wall, not allowing your back to  arch.  You should feel a gentle stretch in the right / left calf. Hold this position for __________ seconds. Repeat __________ times. Complete this stretch __________ times per day. STRETCH - Soleus, Standing  Place hands on wall.  Extend right / left leg, keeping the other knee somewhat bent.  Slightly point your toes inward on your back foot.  Keep your right / left heel on the floor, bend your back knee, and slightly shift your weight over the back leg so that you feel a gentle stretch deep in your back calf.  Hold this position for __________ seconds. Repeat __________ times. Complete this stretch __________ times per day. STRETCH - Gastrocsoleus, Standing  Note: This exercise can place a lot of stress on your foot and ankle. Please complete this exercise only if specifically instructed by your caregiver.   Place the ball of your right / left foot on a step, keeping your other foot firmly on the same step.  Hold on to the wall or a rail for balance.  Slowly lift your other foot, allowing your body weight to press your heel down over the edge of the step.  You should feel a stretch in your right / left calf.  Hold this position for __________ seconds.  Repeat this exercise with a slight bend in your right / left knee. Repeat __________ times. Complete this stretch __________ times per day.  STRENGTHENING EXERCISES - Plantar Fasciitis (Heel Spur Syndrome)  These exercises may help you when beginning to rehabilitate your injury. They may resolve your symptoms with or without further involvement from your physician, physical therapist or athletic trainer. While completing these exercises, remember:   Muscles can gain both the endurance and the strength needed for everyday activities through controlled exercises.  Complete these exercises as instructed by your physician, physical therapist or athletic trainer. Progress the resistance and repetitions only as guided. STRENGTH -  Towel Curls  Sit in a chair positioned on a non-carpeted surface.  Place your foot on a towel, keeping your heel on the floor.  Pull the towel toward your heel by only curling your toes. Keep your heel on the floor.  If instructed by your physician, physical therapist or athletic trainer, add ____________________ at the end of the towel. Repeat __________ times. Complete this exercise __________ times per day. STRENGTH - Ankle Inversion  Secure one end of a rubber exercise band/tubing to a fixed object (table, pole). Loop the other end around your foot just before your toes.  Place your fists between your knees. This will focus your strengthening at your ankle.  Slowly, pull your big toe up and in, making sure the band/tubing is positioned to resist the entire motion.  Hold this position for __________ seconds.  Have your muscles resist the band/tubing as it slowly pulls your foot back to the starting position. Repeat __________ times. Complete this exercises __________ times per day.  Document Released: 08/19/2005 Document Revised: 11/11/2011 Document Reviewed: 12/01/2008 [facility] Patient Information 2015 [place], [place]. This information is not intended to replace advice given to you by your health care provider. Make sure you discuss any questions you have with your health care provider.  Candidal Vulvovaginitis Candidal vulvovaginitis is an infection of the vagina and vulva. The vulva is the skin around the opening of the vagina. This may cause itching and discomfort in and around the vagina.  HOME CARE  Only take medicine as told by your doctor.  Do not have sex (intercourse) until the infection is healed or as told by your doctor.  Practice safe sex.  Tell your sex partner about your infection.  Do not douche or use tampons.  Wear cotton underwear. Do not wear tight pants or panty hose.  Eat yogurt. This may help treat and prevent yeast infections. GET HELP RIGHT AWAY  IF:   You have a fever.  Your problems get worse during treatment or do not get better in 3 days.  You have discomfort, irritation, or itching in your vagina or vulva area.  You have pain after sex.  You start to get belly (abdominal) pain. MAKE SURE YOU:  Understand these instructions.  Will watch your condition.  Will get help right away if you are not doing well or get worse. Document Released: 11/15/2008 Document Revised: 08/24/2013 Document Reviewed: 11/15/2008 [facility] Patient Information 2015 [place], [place]. This information is not intended to replace advice given to you by your health care provider. Make sure you discuss any questions you have with your health care provider.

## 2014-09-27 NOTE — ED Provider Notes (Signed)
CSN: [id number]     Arrival date & time 09/27/14  1853 History  This chart was scribed for non-physician practitioner working with No att. providers found by Mercy Moore, ED Scribe. This patient was seen in room [place] and the patient's care was started at 7:24 PM.   Chief Complaint  Patient presents with  . Foot Pain    pt c/o sharp pains on bottom of feet   The history is provided by the patient. No language interpreter was used.   HPI Comments: Bridget Wilson is a 29 y.o. female who presents to the Emergency Department with complaining of worsening left plantar foot pain ongoing for two weeks. Patient states that she's been working at [employer] for 10 months now; she reports development of the foot pain due to her prolonged standing/working on their concrete floors. Patient states that she attempted to change her shoes to prevent the pain, but she has been unable to find relief. Patient reports when waking up in the morning and planting her feet on her floor she experiences severe plantar pain. Patient presents in a ortho boot applied at home. Boot given to patient by her mother. Patient is ambulatory but reports that she she has been limping, preferring not to bear weight on her left foot.   Secondarily patient reports that she is currently taking an antibiotic for a bladder infection and her "stuff down there is going crazy." Patient reports vaginal itching and thick discharge indicative of yeast infection.   Past Medical History  Diagnosis Date  . Anxiety     little bit  . Chlamydia    Past Surgical History  Procedure Laterality Date  . Cholecystectomy, laparoscopic    . Cholecystectomy    . Teeth pulled     Family History  Problem Relation Age of Onset  . Anesthesia problems Neg Hx   . Hypotension Neg Hx   . Malignant hyperthermia Neg Hx   . Pseudochol deficiency Neg Hx   . Hypertension Mother   . Diabetes Mother    History  Substance Use Topics  .  Smoking status: Former Smoker -- 0.25 packs/day    Types: Cigarettes  . Smokeless tobacco: Never Used     Comment: quit 08/12  . Alcohol Use: Yes     Comment: occ   OB History    Gravida Para Term Preterm AB TAB SAB Ectopic Multiple Living   2 2 2  0 0 0 0 0 0 2     Review of Systems  Constitutional: Negative for fever and chills.  HENT: Negative.   Respiratory: Negative.   Cardiovascular: Negative.   Gastrointestinal: Negative.   Endocrine: Negative.   Genitourinary: Positive for vaginal discharge.  Musculoskeletal: Positive for gait problem.  Skin: Negative for wound.  Allergic/Immunologic: Negative.   Neurological: Negative.  Negative for weakness and numbness.  Hematological: Positive for adenopathy.  Psychiatric/Behavioral: Negative.   All other systems reviewed and are negative.  Allergies  Review of patient's allergies indicates no known allergies.  Home Medications   Prior to Admission medications   Medication Sig Start Date End Date Taking? Authorizing Provider  acetaminophen (TYLENOL) 500 MG tablet Take 1,000 mg by mouth every 6 (six) hours as needed. For pain    Historical Provider, MD  cephALEXin (KEFLEX) 500 MG capsule Take 1 capsule (500 mg total) by mouth 4 (four) times daily. 09/14/14   Olegario Messier, NP  naproxen (NAPROSYN) 500 MG tablet Take 1 tablet (500 mg  total) by mouth 2 (two) times daily with a meal. 09/27/14   Margarita Mail, PA-C  norgestimate-ethinyl estradiol (ORTHO-CYCLEN, 28,) 0.25-35 MG-MCG tablet Take 1 tablet by mouth daily. 09/14/14   Olegario Messier, NP   Triage Vitals: BP 122/74 mmHg  Pulse 82  Temp(Src) 98.1 F (36.7 C) (Oral)  Resp 16  SpO2 100%  LMP 08/29/2014 Physical Exam  Constitutional: She is oriented to person, place, and time. She appears well-developed and well-nourished. No distress.  HENT:  Head: Normocephalic and atraumatic.  Eyes: Conjunctivae and EOM are normal. No scleral icterus.  Neck: Normal range of motion.  Neck supple. No tracheal deviation present.  Cardiovascular: Normal rate, regular rhythm and normal heart sounds.  Exam reveals no gallop and no friction rub.   No murmur heard. Pulmonary/Chest: Effort normal and breath sounds normal. No respiratory distress.  Abdominal: Soft. Bowel sounds are normal. She exhibits no distension and no mass. There is no tenderness. There is no guarding.  Musculoskeletal: Normal range of motion.  A left foot exam was performed. Skin: normal Swelling: none Warmth: no warmth Tenderness: moderate and maximal at plantar and base of the heel Strength: normal Gait: antalgic Neurological Exam: normal Vascular Exam: normal   Neurological: She is alert and oriented to person, place, and time.  Skin: Skin is warm and dry. She is not diaphoretic.  Psychiatric: She has a normal mood and affect. Her behavior is normal.  Nursing note and vitals reviewed.   ED Course  Procedures (including critical care time)  COORDINATION OF CARE: 7:32 PM- Will refer to podiatrist. Discussed treatment plan with patient at bedside and patient agreed to plan.   Labs Review Labs Reviewed - No data to display  Imaging Review No results found.   EKG Interpretation None      MDM   Final diagnoses:  Plantar fasciitis of left foot  Yeast vaginitis   Patient sxs consistent with plantar fasciitis and yeast vaginitis . hte patient will be treated here with diflucan 150,mg Home with NSAIDs, Ice, and a podiatric referral.   I personally performed the services described in this documentation, which was scribed in my presence. The recorded information has been reviewed and is accurate.    Margarita Mail, PA-C 09/28/14 1005  Dorie Rank, MD 09/29/14 [phone]

## 2014-09-27 NOTE — ED Notes (Signed)
Pt reports a tugging, burning, cramping sensation in bottom of l/foot x 2 weeks.Increased burning sensation over last two days. Denies trauma

## 2014-09-29 ENCOUNTER — Emergency Department (HOSPITAL_COMMUNITY)
Admission: EM | Admit: 2014-09-29 | Discharge: 2014-09-30 | Disposition: A | Discharge status: Home / Self Care | Payer: Self-pay | Attending: Emergency Medicine | Admitting: Emergency Medicine

## 2014-09-29 ENCOUNTER — Encounter (HOSPITAL_COMMUNITY): Payer: Self-pay | Admitting: Emergency Medicine

## 2014-09-29 DIAGNOSIS — R110 Nausea: Secondary | ICD-10-CM | POA: Insufficient documentation

## 2014-09-29 DIAGNOSIS — Z9049 Acquired absence of other specified parts of digestive tract: Secondary | ICD-10-CM | POA: Insufficient documentation

## 2014-09-29 DIAGNOSIS — Z8619 Personal history of other infectious and parasitic diseases: Secondary | ICD-10-CM | POA: Insufficient documentation

## 2014-09-29 DIAGNOSIS — Z791 Long term (current) use of non-steroidal anti-inflammatories (NSAID): Secondary | ICD-10-CM | POA: Insufficient documentation

## 2014-09-29 DIAGNOSIS — Z87891 Personal history of nicotine dependence: Secondary | ICD-10-CM | POA: Insufficient documentation

## 2014-09-29 DIAGNOSIS — Z8659 Personal history of other mental and behavioral disorders: Secondary | ICD-10-CM | POA: Insufficient documentation

## 2014-09-29 DIAGNOSIS — M79672 Pain in left foot: Secondary | ICD-10-CM | POA: Insufficient documentation

## 2014-09-29 DIAGNOSIS — Z792 Long term (current) use of antibiotics: Secondary | ICD-10-CM | POA: Insufficient documentation

## 2014-09-29 NOTE — ED Notes (Addendum)
Pt has multiple complaints   Pt is c/o left foot pain that she was seen here for the other day  Pt states she was told she had plantar fascitis and was given an ace wrap and naproxen with a referral to follow up with ortho  Pt states she called ortho and they wanted to know why she was not given an ortho shoe or crutches and cannot see her for 3 weeks  Pt states she has sharp stabbing pain in her foot and can only walk on her "tippy toes" and that is causing her to have cramping in her foot   Pt states [facility] does not think she had enough antibiotics because she has been running a low grade fever, nausea, and has vomited x 2, and the antibiotic gave her a yeast infection

## 2014-09-30 ENCOUNTER — Emergency Department (HOSPITAL_COMMUNITY): Payer: Self-pay

## 2014-09-30 LAB — CBC WITH DIFFERENTIAL/PLATELET
BASOS PCT: 0 % (ref 0–1)
Basophils Absolute: 0 10*3/uL (ref 0.0–0.1)
EOS ABS: 0.1 10*3/uL (ref 0.0–0.7)
Eosinophils Relative: 1 % (ref 0–5)
HEMATOCRIT: 32.4 % — AB (ref 36.0–46.0)
Hemoglobin: 10.8 g/dL — ABNORMAL LOW (ref 12.0–15.0)
LYMPHS ABS: 2.5 10*3/uL (ref 0.7–4.0)
LYMPHS PCT: 44 % (ref 12–46)
MCH: 30.3 pg (ref 26.0–34.0)
MCHC: 33.3 g/dL (ref 30.0–36.0)
MCV: 91 fL (ref 78.0–100.0)
MONOS PCT: 7 % (ref 3–12)
Monocytes Absolute: 0.4 10*3/uL (ref 0.1–1.0)
NEUTROS ABS: 2.8 10*3/uL (ref 1.7–7.7)
NEUTROS PCT: 48 % (ref 43–77)
Platelets: 273 10*3/uL (ref 150–400)
RBC: 3.56 MIL/uL — ABNORMAL LOW (ref 3.87–5.11)
RDW: 13.4 % (ref 11.5–15.5)
WBC: 5.8 10*3/uL (ref 4.0–10.5)

## 2014-09-30 LAB — COMPREHENSIVE METABOLIC PANEL
ALK PHOS: 40 U/L (ref 39–117)
ALT: 36 U/L — AB (ref 0–35)
ANION GAP: 6 (ref 5–15)
AST: 14 U/L (ref 0–37)
Albumin: 3.4 g/dL — ABNORMAL LOW (ref 3.5–5.2)
BUN: 11 mg/dL (ref 6–23)
CO2: 21 mmol/L (ref 19–32)
Calcium: 8.5 mg/dL (ref 8.4–10.5)
Chloride: 112 mmol/L (ref 96–112)
Creatinine, Ser: 0.54 mg/dL (ref 0.50–1.10)
GFR calc Af Amer: >90 mL/min (ref >90)
GFR calc non Af Amer: >90 mL/min (ref >90)
GLUCOSE: 86 mg/dL (ref 70–99)
POTASSIUM: 3.7 mmol/L (ref 3.5–5.1)
Sodium: 139 mmol/L (ref 135–145)
TOTAL PROTEIN: 5.9 g/dL — AB (ref 6.0–8.3)
Total Bilirubin: 0.5 mg/dL (ref 0.3–1.2)

## 2014-09-30 LAB — URINALYSIS, ROUTINE W REFLEX MICROSCOPIC
Bilirubin Urine: NEGATIVE
Glucose, UA: NEGATIVE mg/dL
Hgb urine dipstick: NEGATIVE
Ketones, ur: NEGATIVE mg/dL
LEUKOCYTES UA: NEGATIVE
Nitrite: NEGATIVE
Protein, ur: NEGATIVE mg/dL
Specific Gravity, Urine: 1.029 (ref 1.005–1.030)
Urobilinogen, UA: 0.2 mg/dL (ref 0.0–1.0)
pH: 6.5 (ref 5.0–8.0)

## 2014-09-30 LAB — LIPASE, BLOOD: Lipase: 20 U/L (ref 11–59)

## 2014-09-30 MED ORDER — OXYCODONE-ACETAMINOPHEN 5-325 MG PO TABS: 1.0000 | ORAL_TABLET | ORAL | Status: DC | PRN

## 2014-09-30 MED ORDER — DIPHENHYDRAMINE HCL 25 MG PO CAPS
25.0000 mg | ORAL_CAPSULE | Freq: Once | ORAL | Status: AC
  Administered 2014-09-30: 25 mg via ORAL
  Filled 2014-09-30: qty 1

## 2014-09-30 MED ORDER — OXYCODONE-ACETAMINOPHEN 5-325 MG PO TABS
1.0000 | ORAL_TABLET | Freq: Once | ORAL | Status: AC
  Administered 2014-09-30: 1 via ORAL
  Filled 2014-09-30: qty 1

## 2014-09-30 NOTE — ED Provider Notes (Signed)
CSN: [id number]     Arrival date & time 09/29/14  2321 History   First MD Initiated Contact with Patient 09/29/14 2348     Chief Complaint  Patient presents with  . Foot Pain  . Fever  . Nausea   HPI  Patient is a 29 year old female with past medical history of anxiety who presents emergency room for evaluation of multiple complaints. First complaint is that the patient has been having left foot pain specifically around her heel for several weeks. She states the pain is excruciating. It is worse when she is on her feet. She was seen 2 days ago and was diagnosed with plantar fasciitis and was given naproxen in an Ace wrap. Patient states that neither of these aren't helping at all. She was told to follow-up with an orthopedist but since she cannot be seen in the in office first 3 weeks. She is requesting crutches and a postop shoe per the orthos request. Complaint #2 is that the patient has fever. Mother states the patient has been having low-grade fevers that are responsive to medication. She states that her fevers have been a maximum temperature of the low 100. She has been taking Tylenol and naproxen which relieve her fevers. Patient was recently treated for urinary tract infection.  Patient was given Keflex 500 mg 4 times a day on 09/16/2014. Patient states she completed the full course of her Keflex. Patient was also recently treated for yeast infection. Patient given Diflucan on 09/27/2014. Patient is requesting pain medication for her foot and a work note. Patient also complains of intermittent nausea. She has had no vomiting.  Past Medical History  Diagnosis Date  . Anxiety     little bit  . Chlamydia    Past Surgical History  Procedure Laterality Date  . Cholecystectomy, laparoscopic    . Cholecystectomy    . Teeth pulled     Family History  Problem Relation Age of Onset  . Anesthesia problems Neg Hx   . Hypotension Neg Hx   . Malignant hyperthermia Neg Hx   . Pseudochol  deficiency Neg Hx   . Hypertension Mother   . Diabetes Mother    History  Substance Use Topics  . Smoking status: Former Smoker -- 0.25 packs/day    Types: Cigarettes  . Smokeless tobacco: Never Used     Comment: quit 08/12  . Alcohol Use: Yes     Comment: occ   OB History    Gravida Para Term Preterm AB TAB SAB Ectopic Multiple Living   2 2 2  0 0 0 0 0 0 2     Review of Systems  Constitutional: Negative for fever, chills and fatigue.  Respiratory: Negative for chest tightness and shortness of breath.   Cardiovascular: Negative for chest pain and palpitations.  Gastrointestinal: Positive for nausea. Negative for vomiting, abdominal pain, diarrhea and constipation.  Genitourinary: Positive for frequency and hematuria. Negative for dysuria, urgency, vaginal bleeding, vaginal discharge and difficulty urinating.  Skin: Negative for rash.  All other systems reviewed and are negative.     Allergies  Review of patient's allergies indicates no known allergies.  Home Medications   Prior to Admission medications   Medication Sig Start Date End Date Taking? Authorizing Provider  acetaminophen (TYLENOL) 500 MG tablet Take 1,000 mg by mouth every 6 (six) hours as needed for fever. For pain   Yes Historical Provider, MD  acetaminophen-codeine (TYLENOL #3) 300-30 MG per tablet Take 1 tablet by mouth every  4 (four) hours as needed for moderate pain.   Yes Historical Provider, MD  ibuprofen (ADVIL,MOTRIN) 200 MG tablet Take 400-600 mg by mouth every 6 (six) hours as needed for moderate pain.   Yes Historical Provider, MD  naproxen (NAPROSYN) 500 MG tablet Take 1 tablet (500 mg total) by mouth 2 (two) times daily with a meal. 09/27/14  Yes Margarita Mail, PA-C  norgestimate-ethinyl estradiol (ORTHO-CYCLEN, 28,) 0.25-35 MG-MCG tablet Take 1 tablet by mouth daily. 09/14/14  Yes Olegario Messier, NP  cephALEXin (KEFLEX) 500 MG capsule Take 1 capsule (500 mg total) by mouth 4 (four) times  daily. Patient not taking: Reported on 09/29/2014 09/14/14   Connye Burkitt Barefoot, NP   BP 122/57 mmHg  Pulse 66  Temp(Src) 98.1 F (36.7 C) (Oral)  Resp 18  SpO2 100%  LMP 08/29/2014 Physical Exam  Constitutional: She is oriented to person, place, and time. She appears well-developed and well-nourished. No distress.  HENT:  Head: Normocephalic and atraumatic.  Mouth/Throat: Oropharynx is clear and moist. No oropharyngeal exudate.  Eyes: Conjunctivae and EOM are normal. Pupils are equal, round, and reactive to light. No scleral icterus.  Neck: Normal range of motion. Neck supple. No JVD present. No thyromegaly present.  Cardiovascular: Normal rate, regular rhythm, normal heart sounds and intact distal pulses.  Exam reveals no gallop and no friction rub.   No murmur heard. Pulmonary/Chest: Effort normal and breath sounds normal. No respiratory distress. She has no wheezes. She has no rales. She exhibits no tenderness.  Abdominal: Soft. Bowel sounds are normal. She exhibits no distension and no mass. There is no tenderness. There is no rebound and no guarding.  Musculoskeletal: Normal range of motion.       Left foot: There is tenderness and bony tenderness. There is normal range of motion, no swelling, normal capillary refill, no crepitus, no deformity and no laceration.  Patient is tenderness palpation of the insertion of the plantar fascia of the heel. There is no tenderness to palpation. There are no palpable deformities. Observation of the foot is unremarkable.  Lymphadenopathy:    She has no cervical adenopathy.  Neurological: She is alert and oriented to person, place, and time. She has normal strength. No cranial nerve deficit or sensory deficit. Coordination normal.  Skin: Skin is warm and dry. She is not diaphoretic.  Psychiatric: She has a normal mood and affect. Her behavior is normal. Judgment and thought content normal.  Nursing note and vitals reviewed.   ED Course  Procedures  (including critical care time) Labs Review Labs Reviewed  URINE CULTURE  CBC WITH DIFFERENTIAL/PLATELET  COMPREHENSIVE METABOLIC PANEL  URINALYSIS, ROUTINE W REFLEX MICROSCOPIC  LIPASE, BLOOD    Imaging Review No results found.   EKG Interpretation None      MDM   Final diagnoses:  Heel pain, left  Nausea   Patient is a 29 year old female who presents emergency room for evaluation of multiple complaints. Complaint #1 foot pain. Patient having left foot pain. Foot is neurovascularly intact. Good pedal pulses. Foot exam is consistent with heel spurs versus plantar fasciitis. Foot x-rays pending. We'll place in a postop shoe and given crutches. Patient likely needs short course of percocet.  Patient needs to continue taking naproxen for anti-inflammatory purposes. Lipase, UA, CMP, CBC, and urine culture are currently pending. We'll sign the patient out with Charlann Lange PA-C.  Patient to be dispo based on laboratory results. Suspect that she will likely be able to go home with  pain medication and plus or minus antibiotics. If discharged with antibiotics please include diflucan for yeast infection.    Cherylann Parr, PA-C 09/30/14 5732  Varney Biles, MD 09/30/14 2025

## 2014-09-30 NOTE — ED Provider Notes (Signed)
Left heel pain - eval'd 2 days ago "Plantar fasc." Pending x-ray  N, urinary freq./ urgency - recently treated for UTI with subseq. [place]. Yeast. Labs pending, UA pending  Anticipate d/c home Has PCP - appt. Tuesday  Labs, x-ray results without acute process. Re-eval: the patient appears well, NAD. VSS. Stable for discharge home. All questions of patient and mom answered.  Dewaine Oats, PA-C 09/30/14 [phone]

## 2014-09-30 NOTE — Discharge Instructions (Signed)
Cryotherapy Cryotherapy is when you put ice on your injury. Ice helps lessen pain and puffiness (swelling) after an injury. Ice works the best when you start using it in the first 24 to 48 hours after an injury. HOME CARE  Put a dry or damp towel between the ice pack and your skin.  You may press gently on the ice pack.  Leave the ice on for no more than 10 to 20 minutes at a time.  Check your skin after 5 minutes to make sure your skin is okay.  Rest at least 20 minutes between ice pack uses.  Stop using ice when your skin loses feeling (numbness).  Do not use ice on someone who cannot tell you when it hurts. This includes small children and people with memory problems (dementia). GET HELP RIGHT AWAY IF:  You have white spots on your skin.  Your skin turns blue or pale.  Your skin feels waxy or hard.  Your puffiness gets worse. MAKE SURE YOU:   Understand these instructions.  Will watch your condition.  Will get help right away if you are not doing well or get worse. Document Released: 02/05/2008 Document Revised: 11/11/2011 Document Reviewed: 04/11/2011 Exit[employer] Patient Information 2015 [employer]. This information is not intended to replace advice given to you by your health care provider. Make sure you discuss any questions you have with your health care provider.  

## 2014-10-01 LAB — URINE CULTURE
Colony Count: NO GROWTH
Culture: NO GROWTH
Special Requests: NORMAL

## 2015-01-01 ENCOUNTER — Emergency Department (HOSPITAL_COMMUNITY)
Admission: EM | Admit: 2015-01-01 | Discharge: 2015-01-01 | Disposition: A | Discharge status: Home / Self Care | Payer: Self-pay | Attending: Emergency Medicine | Admitting: Emergency Medicine

## 2015-01-01 ENCOUNTER — Encounter (HOSPITAL_COMMUNITY): Payer: Self-pay | Admitting: Emergency Medicine

## 2015-01-01 DIAGNOSIS — Z8619 Personal history of other infectious and parasitic diseases: Secondary | ICD-10-CM | POA: Insufficient documentation

## 2015-01-01 DIAGNOSIS — Z79899 Other long term (current) drug therapy: Secondary | ICD-10-CM | POA: Insufficient documentation

## 2015-01-01 DIAGNOSIS — M722 Plantar fascial fibromatosis: Secondary | ICD-10-CM | POA: Insufficient documentation

## 2015-01-01 DIAGNOSIS — Z87891 Personal history of nicotine dependence: Secondary | ICD-10-CM | POA: Insufficient documentation

## 2015-01-01 DIAGNOSIS — Z793 Long term (current) use of hormonal contraceptives: Secondary | ICD-10-CM | POA: Insufficient documentation

## 2015-01-01 DIAGNOSIS — Z8659 Personal history of other mental and behavioral disorders: Secondary | ICD-10-CM | POA: Insufficient documentation

## 2015-01-01 MED ORDER — NAPROXEN 500 MG PO TABS: 500.0000 mg | ORAL_TABLET | Freq: Two times a day (BID) | ORAL | Status: DC

## 2015-01-01 MED ORDER — IBUPROFEN 800 MG PO TABS
800.0000 mg | ORAL_TABLET | Freq: Once | ORAL | Status: AC
  Administered 2015-01-01: 800 mg via ORAL
  Filled 2015-01-01: qty 1

## 2015-01-01 NOTE — ED Notes (Signed)
Pt was diagnosed with fasciitis in L foot two weeks ago. Pt sts that ever since then the pain has gotten worse and worse. Pt sts she has taken the medications as prescribed and has tried heat and rolling her foot without relief. Pt sts she cannot see the orthopedic MD she was referred to because she has family planning medicaid which is not accepted. Pt A&Ox4 and ambulatory.

## 2015-01-01 NOTE — ED Provider Notes (Signed)
CSN: [id number]     Arrival date & time 01/01/15  1702 History  This chart was scribed for non-physician practitioner Domenic Moras, PA, working with Orlie Dakin, MD, by Eustaquio Maize, ED Scribe. This patient was seen in room [facility] and the patient's care was started at 5:11 PM.    Chief Complaint  Patient presents with  . Foot Pain   The history is provided by the patient. No language interpreter was used.     HPI Comments: Bridget Wilson is a 29 y.o. female who presents to the Emergency Department complaining of left foot pain that began a couple of months ago, worsening the last 2 weeks. Pt states that she fell a couple of weeks ago and landed on her left foot. Pt complains of a cramping sensation radiating up her left leg. She states that she has been taking Tylenol without relief. She also is on her feet most of the day for her jobs. She was diagnosed with left plantar fasciitis a couple of months ago. Pt was seen in the ED at that time. She was referred to orthopedist but has been unable to see orthopedist due to insurance issues. Pt was seen in the ED again and given home remedies, including elevating foot and massaging foot, which did not alleviate symptoms. She denies weakness, numbness, or any other symptoms.   Past Medical History  Diagnosis Date  . Anxiety     little bit  . Chlamydia    Past Surgical History  Procedure Laterality Date  . Cholecystectomy, laparoscopic    . Cholecystectomy    . Teeth pulled     Family History  Problem Relation Age of Onset  . Anesthesia problems Neg Hx   . Hypotension Neg Hx   . Malignant hyperthermia Neg Hx   . Pseudochol deficiency Neg Hx   . Hypertension Mother   . Diabetes Mother    History  Substance Use Topics  . Smoking status: Former Smoker -- 0.25 packs/day    Types: Cigarettes  . Smokeless tobacco: Never Used     Comment: quit 08/12  . Alcohol Use: Yes     Comment: occ   OB History    Gravida Para Term Preterm AB  TAB SAB Ectopic Multiple Living   2 2 2  0 0 0 0 0 0 2     Review of Systems  Musculoskeletal: Positive for arthralgias (Left foot pain. Cramping sensation radiating up left leg. ).  Neurological: Negative for weakness and numbness.      Allergies  Review of patient's allergies indicates no known allergies.  Home Medications   Prior to Admission medications   Medication Sig Start Date End Date Taking? Authorizing Provider  acetaminophen (TYLENOL) 500 MG tablet Take 1,000 mg by mouth every 6 (six) hours as needed for fever. For pain    Historical Provider, MD  acetaminophen-codeine (TYLENOL #3) 300-30 MG per tablet Take 1 tablet by mouth every 4 (four) hours as needed for moderate pain.    Historical Provider, MD  cephALEXin (KEFLEX) 500 MG capsule Take 1 capsule (500 mg total) by mouth 4 (four) times daily. Patient not taking: Reported on 09/29/2014 09/14/14   Olegario Messier, NP  ibuprofen (ADVIL,MOTRIN) 200 MG tablet Take 400-600 mg by mouth every 6 (six) hours as needed for moderate pain.    Historical Provider, MD  naproxen (NAPROSYN) 500 MG tablet Take 1 tablet (500 mg total) by mouth 2 (two) times daily with a meal. 09/27/14  Margarita Mail, PA-C  norgestimate-ethinyl estradiol (ORTHO-CYCLEN, 28,) 0.25-35 MG-MCG tablet Take 1 tablet by mouth daily. 09/14/14   Olegario Messier, NP  oxyCODONE-acetaminophen (PERCOCET/ROXICET) 5-325 MG per tablet Take 1-2 tablets by mouth every 4 (four) hours as needed for severe pain. 09/30/14   Charlann Lange, PA-C   Triage Vitals: BP 121/60 mmHg  Pulse 77  Resp 18  SpO2 100%   Physical Exam  Constitutional: She is oriented to person, place, and time. She appears well-developed and well-nourished. No distress.  HENT:  Head: Normocephalic and atraumatic.  Eyes: Conjunctivae and EOM are normal.  Neck: Neck supple. No tracheal deviation present.  Cardiovascular: Normal rate, regular rhythm and normal heart sounds.   Pulmonary/Chest: Effort normal  and breath sounds normal. No respiratory distress.  Musculoskeletal: Normal range of motion.  Pain to the sole of left foot and also to the left heel on palpation.  No overlying skin changes.  No evidence of pesplanus. Normal dorsiflexion and plantarflexion of left foot.  Normal inversion and eversion of left foot. Left calf compartment is soft.  Left dorsalis pedis pulse palpable.   Neurological: She is alert and oriented to person, place, and time.  Skin: Skin is warm and dry.  Psychiatric: She has a normal mood and affect. Her behavior is normal.  Nursing note and vitals reviewed.   ED Course  Procedures (including critical care time)  DIAGNOSTIC STUDIES: Oxygen Saturation is 100% on RA, normal by my interpretation.    COORDINATION OF CARE: 5:16 PM- Agree that symptoms seem to be plantar fasciitis. Advise pt to follow up with orthopedist and to take Naproxen for the pain. Recommend orthotic shoe.  Use water bottle or tennis ball to roll under the arch of foot.  RICE therapy.  Foot exercise, weight reduction.    Labs Review Labs Reviewed - No data to display  Imaging Review No results found.   EKG Interpretation None      MDM   Final diagnoses:  Plantar fasciitis of left foot   BP 121/60 mmHg  Pulse 77  Temp(Src) 98.3 F (36.8 C) (Oral)  Resp 18  SpO2 100%  LMP 01/01/2015 I personally performed the services described in this documentation, which was scribed in my presence. The recorded information has been reviewed and is accurate.       Domenic Moras, PA-C 01/01/15 1723  Orlie Dakin, MD 01/01/15 [phone]

## 2015-01-01 NOTE — Discharge Instructions (Signed)
Plantar Fasciitis (Heel Spur Syndrome) with Rehab The plantar fascia is a fibrous, ligament-like, soft-tissue structure that spans the bottom of the foot. Plantar fasciitis is a condition that causes pain in the foot due to inflammation of the tissue. SYMPTOMS   Pain and tenderness on the underneath side of the foot.  Pain that worsens with standing or walking. CAUSES  Plantar fasciitis is caused by irritation and injury to the plantar fascia on the underneath side of the foot. Common mechanisms of injury include:  Direct trauma to bottom of the foot.  Damage to a small nerve that runs under the foot where the main fascia attaches to the heel bone.  Stress placed on the plantar fascia due to bone spurs. RISK INCREASES WITH:   Activities that place stress on the plantar fascia (running, jumping, pivoting, or cutting).  Poor strength and flexibility.  Improperly fitted shoes.  Tight calf muscles.  Flat feet.  Failure to warm-up properly before activity.  Obesity. PREVENTION  Warm up and stretch properly before activity.  Allow for adequate recovery between workouts.  Maintain physical fitness:  Strength, flexibility, and endurance.  Cardiovascular fitness.  Maintain a health body weight.  Avoid stress on the plantar fascia.  Wear properly fitted shoes, including arch supports for individuals who have flat feet. PROGNOSIS  If treated properly, then the symptoms of plantar fasciitis usually resolve without surgery. However, occasionally surgery is necessary. RELATED COMPLICATIONS   Recurrent symptoms that may result in a chronic condition.  Problems of the lower back that are caused by compensating for the injury, such as limping.  Pain or weakness of the foot during push-off following surgery.  Chronic inflammation, scarring, and partial or complete fascia tear, occurring more often from repeated injections. TREATMENT  Treatment initially involves the use of  ice and medication to help reduce pain and inflammation. The use of strengthening and stretching exercises may help reduce pain with activity, especially stretches of the Achilles tendon. These exercises may be performed at home or with a therapist. Your caregiver may recommend that you use heel cups of arch supports to help reduce stress on the plantar fascia. Occasionally, corticosteroid injections are given to reduce inflammation. If symptoms persist for greater than 6 months despite non-surgical (conservative), then surgery may be recommended.  MEDICATION   If pain medication is necessary, then nonsteroidal anti-inflammatory medications, such as aspirin and ibuprofen, or other minor pain relievers, such as acetaminophen, are often recommended.  Do not take pain medication within 7 days before surgery.  Prescription pain relievers may be given if deemed necessary by your caregiver. Use only as directed and only as much as you need.  Corticosteroid injections may be given by your caregiver. These injections should be reserved for the most serious cases, because they may only be given a certain number of times. HEAT AND COLD  Cold treatment (icing) relieves pain and reduces inflammation. Cold treatment should be applied for 10 to 15 minutes every 2 to 3 hours for inflammation and pain and immediately after any activity that aggravates your symptoms. Use ice packs or massage the area with a piece of ice (ice massage).  Heat treatment may be used prior to performing the stretching and strengthening activities prescribed by your caregiver, physical therapist, or athletic trainer. Use a heat pack or soak the injury in warm water. SEEK IMMEDIATE MEDICAL CARE IF:  Treatment seems to offer no benefit, or the condition worsens.  Any medications produce adverse side effects. EXERCISES RANGE   OF MOTION (ROM) AND STRETCHING EXERCISES - Plantar Fasciitis (Heel Spur Syndrome) These exercises may help you  when beginning to rehabilitate your injury. Your symptoms may resolve with or without further involvement from your physician, physical therapist or athletic trainer. While completing these exercises, remember:   Restoring tissue flexibility helps normal motion to return to the joints. This allows healthier, less painful movement and activity.  An effective stretch should be held for at least 30 seconds.  A stretch should never be painful. You should only feel a gentle lengthening or release in the stretched tissue. RANGE OF MOTION - Toe Extension, Flexion  Sit with your right / left leg crossed over your opposite knee.  Grasp your toes and gently pull them back toward the top of your foot. You should feel a stretch on the bottom of your toes and/or foot.  Hold this stretch for __________ seconds.  Now, gently pull your toes toward the bottom of your foot. You should feel a stretch on the top of your toes and or foot.  Hold this stretch for __________ seconds. Repeat __________ times. Complete this stretch __________ times per day.  RANGE OF MOTION - Ankle Dorsiflexion, Active Assisted  Remove shoes and sit on a chair that is preferably not on a carpeted surface.  Place right / left foot under knee. Extend your opposite leg for support.  Keeping your heel down, slide your right / left foot back toward the chair until you feel a stretch at your ankle or calf. If you do not feel a stretch, slide your bottom forward to the edge of the chair, while still keeping your heel down.  Hold this stretch for __________ seconds. Repeat __________ times. Complete this stretch __________ times per day.  STRETCH - Gastroc, Standing  Place hands on wall.  Extend right / left leg, keeping the front knee somewhat bent.  Slightly point your toes inward on your back foot.  Keeping your right / left heel on the floor and your knee straight, shift your weight toward the wall, not allowing your back to  arch.  You should feel a gentle stretch in the right / left calf. Hold this position for __________ seconds. Repeat __________ times. Complete this stretch __________ times per day. STRETCH - Soleus, Standing  Place hands on wall.  Extend right / left leg, keeping the other knee somewhat bent.  Slightly point your toes inward on your back foot.  Keep your right / left heel on the floor, bend your back knee, and slightly shift your weight over the back leg so that you feel a gentle stretch deep in your back calf.  Hold this position for __________ seconds. Repeat __________ times. Complete this stretch __________ times per day. STRETCH - Gastrocsoleus, Standing  Note: This exercise can place a lot of stress on your foot and ankle. Please complete this exercise only if specifically instructed by your caregiver.   Place the ball of your right / left foot on a step, keeping your other foot firmly on the same step.  Hold on to the wall or a rail for balance.  Slowly lift your other foot, allowing your body weight to press your heel down over the edge of the step.  You should feel a stretch in your right / left calf.  Hold this position for __________ seconds.  Repeat this exercise with a slight bend in your right / left knee. Repeat __________ times. Complete this stretch __________ times per day.    STRENGTHENING EXERCISES - Plantar Fasciitis (Heel Spur Syndrome)  These exercises may help you when beginning to rehabilitate your injury. They may resolve your symptoms with or without further involvement from your physician, physical therapist or athletic trainer. While completing these exercises, remember:   Muscles can gain both the endurance and the strength needed for everyday activities through controlled exercises.  Complete these exercises as instructed by your physician, physical therapist or athletic trainer. Progress the resistance and repetitions only as guided. STRENGTH -  Towel Curls  Sit in a chair positioned on a non-carpeted surface.  Place your foot on a towel, keeping your heel on the floor.  Pull the towel toward your heel by only curling your toes. Keep your heel on the floor.  If instructed by your physician, physical therapist or athletic trainer, add ____________________ at the end of the towel. Repeat __________ times. Complete this exercise __________ times per day. STRENGTH - Ankle Inversion  Secure one end of a rubber exercise band/tubing to a fixed object (table, pole). Loop the other end around your foot just before your toes.  Place your fists between your knees. This will focus your strengthening at your ankle.  Slowly, pull your big toe up and in, making sure the band/tubing is positioned to resist the entire motion.  Hold this position for __________ seconds.  Have your muscles resist the band/tubing as it slowly pulls your foot back to the starting position. Repeat __________ times. Complete this exercises __________ times per day.  Document Released: 08/19/2005 Document Revised: 11/11/2011 Document Reviewed: 12/01/2008 [employer] Patient Information 2015 [employer]. This information is not intended to replace advice given to you by your health care provider. Make sure you discuss any questions you have with your health care provider.  

## 2016-03-10 ENCOUNTER — Emergency Department (HOSPITAL_COMMUNITY)
Admission: EM | Admit: 2016-03-10 | Discharge: 2016-03-11 | Disposition: A | Discharge status: Home / Self Care | Payer: Self-pay | Attending: Emergency Medicine | Admitting: Emergency Medicine

## 2016-03-10 ENCOUNTER — Encounter (HOSPITAL_COMMUNITY): Payer: Self-pay | Admitting: *Deleted

## 2016-03-10 DIAGNOSIS — F17210 Nicotine dependence, cigarettes, uncomplicated: Secondary | ICD-10-CM | POA: Insufficient documentation

## 2016-03-10 DIAGNOSIS — M722 Plantar fascial fibromatosis: Secondary | ICD-10-CM | POA: Insufficient documentation

## 2016-03-10 NOTE — ED Notes (Signed)
Pt c/o rt foot pain; pt states that the pain is worse in the morning when she gets out of bed; pt state that the pain is to her rt heel and is worse with movement and pressure

## 2016-03-11 ENCOUNTER — Emergency Department (HOSPITAL_COMMUNITY): Payer: Self-pay

## 2016-03-11 MED ORDER — NAPROXEN 500 MG PO TABS: 500.0000 mg | ORAL_TABLET | Freq: Two times a day (BID) | ORAL | Status: DC

## 2016-03-11 NOTE — Discharge Instructions (Signed)
Plantar Fasciitis Plantar fasciitis is a painful foot condition that affects the heel. It occurs when the band of tissue that connects the toes to the heel bone (plantar fascia) becomes irritated. This can happen after exercising too much or doing other repetitive activities (overuse injury). The pain from plantar fasciitis can range from mild irritation to severe pain that makes it difficult for you to walk or move. The pain is usually worse in the morning or after you have been sitting or lying down for a while. CAUSES This condition may be caused by:  Standing for long periods of time.  Wearing shoes that do not fit.  Doing high-impact activities, including running, aerobics, and ballet.  Being overweight.  Having an abnormal way of walking (gait).  Having tight calf muscles.  Having high arches in your feet.  Starting a new athletic activity. SYMPTOMS The main symptom of this condition is heel pain. Other symptoms include:  Pain that gets worse after activity or exercise.  Pain that is worse in the morning or after resting.  Pain that goes away after you walk for a few minutes. DIAGNOSIS This condition may be diagnosed based on your signs and symptoms. Your health care provider will also do a physical exam to check for:  A tender area on the bottom of your foot.  A high arch in your foot.  Pain when you move your foot.  Difficulty moving your foot. You may also need to have imaging studies to confirm the diagnosis. These can include:  X-rays.  Ultrasound.  MRI. TREATMENT  Treatment for plantar fasciitis depends on the severity of the condition. Your treatment may include:  Rest, ice, and over-the-counter pain medicines to manage your pain.  Exercises to stretch your calves and your plantar fascia.  A splint that holds your foot in a stretched, upward position while you sleep (night splint).  Physical therapy to relieve symptoms and prevent problems in the  future.  Cortisone injections to relieve severe pain.  Extracorporeal shock wave therapy (ESWT) to stimulate damaged plantar fascia with electrical impulses. It is often used as a last resort before surgery.  Surgery, if other treatments have not worked after 12 months. HOME CARE INSTRUCTIONS  Take medicines only as directed by your health care provider.  Avoid activities that cause pain.  Roll the bottom of your foot over a bag of ice or a bottle of cold water. Do this for 20 minutes, 3-4 times a day.  Perform simple stretches as directed by your health care provider.  Try wearing athletic shoes with air-sole or gel-sole cushions or soft shoe inserts.  Wear a night splint while sleeping, if directed by your health care provider.  Keep all follow-up appointments with your health care provider. PREVENTION   Do not perform exercises or activities that cause heel pain.  Consider finding low-impact activities if you continue to have problems.  Lose weight if you need to. The best way to prevent plantar fasciitis is to avoid the activities that aggravate your plantar fascia. SEEK MEDICAL CARE IF:  Your symptoms do not go away after treatment with home care measures.  Your pain gets worse.  Your pain affects your ability to move or do your daily activities.   This information is not intended to replace advice given to you by your health care provider. Make sure you discuss any questions you have with your health care provider.   Document Released: 05/14/2001 Document Revised: 05/10/2015 Document Reviewed: 06/29/2014 Elsevier   Interactive Patient Education 2016 [employer]  

## 2016-03-11 NOTE — ED Provider Notes (Signed)
CSN: [id number]     Arrival date & time 03/10/16  2343 History   First MD Initiated Contact with Patient 03/11/16 0006     Chief Complaint  Patient presents with  . Foot Pain     (Consider location/radiation/quality/duration/timing/severity/associated sxs/prior Treatment) HPI Comments: Pain is localized to her right heel and is worse with standing. Symptoms started after she fell 2 weeks ago. Denies any ankle pain.  Patient is a 30 y.o. female presenting with lower extremity pain. The history is provided by the patient.  Foot Pain This is a new problem. The current episode started more than 1 week ago. The problem occurs constantly. The problem has not changed since onset.The symptoms are aggravated by walking. Nothing relieves the symptoms. She has tried nothing for the symptoms.    Past Medical History  Diagnosis Date  . Anxiety     little bit  . Chlamydia    Past Surgical History  Procedure Laterality Date  . Cholecystectomy, laparoscopic    . Cholecystectomy    . Teeth pulled     Family History  Problem Relation Age of Onset  . Anesthesia problems Neg Hx   . Hypotension Neg Hx   . Malignant hyperthermia Neg Hx   . Pseudochol deficiency Neg Hx   . Hypertension Mother   . Diabetes Mother    Social History  Substance Use Topics  . Smoking status: Current Every Day Smoker -- 0.15 packs/day    Types: Cigarettes  . Smokeless tobacco: Never Used     Comment: quit 08/12  . Alcohol Use: Yes     Comment: occ   OB History    Gravida Para Term Preterm AB TAB SAB Ectopic Multiple Living   2 2 2  0 0 0 0 0 0 2     Review of Systems  All other systems reviewed and are negative.     Allergies  Review of patient's allergies indicates no known allergies.  Home Medications   Prior to Admission medications   Medication Sig Start Date End Date Taking? Authorizing Provider  acetaminophen (TYLENOL) 500 MG tablet Take 1,000 mg by mouth every 6 (six) hours as needed for  fever. For pain    Historical Provider, MD  acetaminophen-codeine (TYLENOL #3) 300-30 MG per tablet Take 1 tablet by mouth every 4 (four) hours as needed for moderate pain.    Historical Provider, MD  cephALEXin (KEFLEX) 500 MG capsule Take 1 capsule (500 mg total) by mouth 4 (four) times daily. Patient not taking: Reported on 09/29/2014 09/14/14   Olegario Messier, NP  naproxen (NAPROSYN) 500 MG tablet Take 1 tablet (500 mg total) by mouth 2 (two) times daily with a meal. 01/01/15   Domenic Moras, PA-C  norgestimate-ethinyl estradiol (ORTHO-CYCLEN, 28,) 0.25-35 MG-MCG tablet Take 1 tablet by mouth daily. 09/14/14   Olegario Messier, NP  oxyCODONE-acetaminophen (PERCOCET/ROXICET) 5-325 MG per tablet Take 1-2 tablets by mouth every 4 (four) hours as needed for severe pain. 09/30/14   Shari Upstill, PA-C   BP 133/80 mmHg  Pulse 89  Temp(Src) 98.1 F (36.7 C) (Oral)  Resp 18  SpO2 100%  LMP 02/15/2016 Physical Exam  Constitutional: She is oriented to person, place, and time. She appears well-developed and well-nourished.  Non-toxic appearance.  HENT:  Head: Normocephalic and atraumatic.  Eyes: Conjunctivae are normal. Pupils are equal, round, and reactive to light.  Neck: Normal range of motion.  Cardiovascular: Normal rate.   Pulmonary/Chest: Effort normal.  Musculoskeletal:  Feet:  Neurological: She is alert and oriented to person, place, and time.  Skin: Skin is warm and dry.  Psychiatric: She has a normal mood and affect.  Nursing note and vitals reviewed.   ED Course  Procedures (including critical care time) Labs Review Labs Reviewed - No data to display  Imaging Review No results found. I have personally reviewed and evaluated these images and lab results as part of my medical decision-making.   EKG Interpretation None      MDM   Final diagnoses:  None    X-rays neg, suspect fascitis, will place on nsaids and d/c    Lacretia Leigh, MD 03/11/16 [phone]

## 2016-06-11 ENCOUNTER — Encounter (HOSPITAL_COMMUNITY): Payer: Self-pay

## 2016-06-11 ENCOUNTER — Emergency Department (HOSPITAL_COMMUNITY)
Admission: EM | Admit: 2016-06-11 | Discharge: 2016-06-11 | Disposition: A | Discharge status: Home / Self Care | Payer: Self-pay | Attending: Emergency Medicine | Admitting: Emergency Medicine

## 2016-06-11 DIAGNOSIS — Z79899 Other long term (current) drug therapy: Secondary | ICD-10-CM | POA: Insufficient documentation

## 2016-06-11 DIAGNOSIS — F17210 Nicotine dependence, cigarettes, uncomplicated: Secondary | ICD-10-CM | POA: Insufficient documentation

## 2016-06-11 DIAGNOSIS — H1013 Acute atopic conjunctivitis, bilateral: Secondary | ICD-10-CM | POA: Insufficient documentation

## 2016-06-11 MED ORDER — TETRACAINE HCL 0.5 % OP SOLN
2.0000 [drp] | Freq: Once | OPHTHALMIC | Status: AC
  Administered 2016-06-11: 2 [drp] via OPHTHALMIC
  Filled 2016-06-11: qty 4

## 2016-06-11 MED ORDER — FLUORESCEIN SODIUM 1 MG OP STRP
1.0000 | ORAL_STRIP | Freq: Once | OPHTHALMIC | Status: AC
  Administered 2016-06-11: 1 via OPHTHALMIC
  Filled 2016-06-11: qty 1

## 2016-06-11 MED ORDER — OLOPATADINE HCL 0.1 % OP SOLN
1.0000 [drp] | Freq: Two times a day (BID) | OPHTHALMIC | Status: DC
  Administered 2016-06-11: 1 [drp] via OPHTHALMIC
  Filled 2016-06-11: qty 5

## 2016-06-11 NOTE — ED Notes (Signed)
PA at bedside.

## 2016-06-11 NOTE — Discharge Instructions (Signed)
Apply patanol drops, 1 drop in both eyes twice a day. Follow up with ophthalmologist if not improving. Try cool compresses.

## 2016-06-11 NOTE — ED Provider Notes (Signed)
[facility] DEPT Provider Note   CSN: [id number] Arrival date & time: 06/11/16  [id number]     History   Chief Complaint Chief Complaint  Patient presents with  . Eye Problem    HPI Bridget Wilson is a 30 y.o. female.  HPI Bridget Wilson is a 30 y.o. female presents to emergency department complaining of bilateral eye pain, burning, itching. Patient states symptoms began approximately 1 month ago after she used some fake eyelashes and eye glue. She reports she has tried artificial tears and Visine with no improvement in her symptoms. She reports when she wakes up her eyes are crusty, states during the day she has clear drainage, itching, burning. She has not used any new lotions or face products. She does wear eyeliner occasionally and eye shadow. Denies any new products. States some visual changes, but thinks its because her eyes are watering so much.   Past Medical History:  Diagnosis Date  . Anxiety    little bit  . Chlamydia     Patient Active Problem List   Diagnosis Date Noted  . Spotting in early pregnancy 06/24/2011    Past Surgical History:  Procedure Laterality Date  . CHOLECYSTECTOMY    . CHOLECYSTECTOMY, LAPAROSCOPIC    . teeth pulled      OB History    Gravida Para Term Preterm AB Living   2 2 2  0 0 2   SAB TAB Ectopic Multiple Live Births   0 0 0 0 1       Home Medications    Prior to Admission medications   Medication Sig Start Date End Date Taking? Authorizing Provider  acetaminophen (TYLENOL) 500 MG tablet Take 1,000 mg by mouth every 6 (six) hours as needed for fever. For pain    Historical Provider, MD  acetaminophen-codeine (TYLENOL #3) 300-30 MG per tablet Take 1 tablet by mouth every 4 (four) hours as needed for moderate pain.    Historical Provider, MD  cephALEXin (KEFLEX) 500 MG capsule Take 1 capsule (500 mg total) by mouth 4 (four) times daily. Patient not taking: Reported on 09/29/2014 09/14/14   Olegario Messier, NP  naproxen  (NAPROSYN) 500 MG tablet Take 1 tablet (500 mg total) by mouth 2 (two) times daily. 03/11/16   Lacretia Leigh, MD  norgestimate-ethinyl estradiol (ORTHO-CYCLEN, 28,) 0.25-35 MG-MCG tablet Take 1 tablet by mouth daily. 09/14/14   Olegario Messier, NP  oxyCODONE-acetaminophen (PERCOCET/ROXICET) 5-325 MG per tablet Take 1-2 tablets by mouth every 4 (four) hours as needed for severe pain. 09/30/14   Charlann Lange, PA-C    Family History Family History  Problem Relation Age of Onset  . Hypertension Mother   . Diabetes Mother   . Anesthesia problems Neg Hx   . Hypotension Neg Hx   . Malignant hyperthermia Neg Hx   . Pseudochol deficiency Neg Hx     Social History Social History  Substance Use Topics  . Smoking status: Current Every Day Smoker    Packs/day: 0.15    Types: Cigarettes  . Smokeless tobacco: Never Used     Comment: quit 08/12  . Alcohol use Yes     Comment: occ     Allergies   Review of patient's allergies indicates no known allergies.   Review of Systems Review of Systems  Constitutional: Negative for chills and fever.  HENT: Negative for congestion, rhinorrhea, sinus pressure and sore throat.   Eyes: Positive for pain, discharge and itching. Negative for photophobia, redness  and visual disturbance.  Respiratory: Negative for cough.   Neurological: Negative for headaches.  All other systems reviewed and are negative.    Physical Exam Updated Vital Signs BP 134/73 (BP Location: Left Arm)   Pulse 89   Temp 98.2 F (36.8 C) (Oral)   Resp 18   Ht 5\' 7"  (1.702 m)   Wt 117.9 kg   LMP 05/17/2016 (Approximate)   SpO2 95%   BMI 40.72 kg/m   Physical Exam  Constitutional: She appears well-developed and well-nourished. No distress.  Eyes: Conjunctivae, EOM and lids are normal. Pupils are equal, round, and reactive to light. Lids are everted and swept, no foreign bodies found. Right eye exhibits no chemosis, no discharge and no exudate. Left eye exhibits no  chemosis, no discharge and no exudate. Right conjunctiva is not injected. Right conjunctiva has no hemorrhage. Left conjunctiva is not injected. Left conjunctiva has no hemorrhage.  Neck: Neck supple.  Neurological: She is alert.  Skin: Skin is warm and dry.  Nursing note and vitals reviewed.    ED Treatments / Results  Labs (all labs ordered are listed, but only abnormal results are displayed) Labs Reviewed - No data to display  EKG  EKG Interpretation None       Radiology No results found.  Procedures Procedures (including critical care time)  Medications Ordered in ED Medications  olopatadine (PATANOL) 0.1 % ophthalmic solution 1 drop (not administered)  fluorescein ophthalmic strip 1 strip (1 strip Both Eyes Given by Other 06/11/16 1237)  tetracaine (PONTOCAINE) 0.5 % ophthalmic solution 2 drop (2 drops Both Eyes Given by Other 06/11/16 1237)     Initial Impression / Assessment and Plan / ED Course  I have reviewed the triage vital signs and the nursing notes.  Pertinent labs & imaging results that were available during my care of the patient were reviewed by me and considered in my medical decision making (see chart for details).  Clinical Course    Patient with bilateral eye itching, clear drainage, burning for a month. Fluorescein stain bilaterally negative. Eye exam unremarkable. Visual acuities 20/20, 20/25. Will start on Patanol drops, most likely allergic conjunctivitis. Will have her follow-up with ophthalmology.  Vitals:   06/11/16 1003 06/11/16 1006 06/11/16 1346  BP: 134/73  142/90  Pulse: 89  90  Resp: 18  17  Temp: 98.2 F (36.8 C)    TempSrc: Oral    SpO2: 95%  99%  Weight:  117.9 kg   Height:  5\' 7"  (1.702 m)      Final Clinical Impressions(s) / ED Diagnoses   Final diagnoses:  Allergic conjunctivitis of both eyes    New Prescriptions Discharge Medication List as of 06/11/2016  1:08 PM       Jeannett Senior, PA-C 06/11/16  2014    Dorie Rank, MD 06/12/16 2136

## 2016-06-11 NOTE — ED Triage Notes (Signed)
Pt presents with c/o eye problem in both eyes. Pt reports she had lashes placed on her eyelids approx one month ago and ever since then, her eyes have been burning, red, and she feels like there is a cloud over her eyes. Pt reports that her eyes have "not been right" ever since then.

## 2016-08-01 DIAGNOSIS — F17210 Nicotine dependence, cigarettes, uncomplicated: Secondary | ICD-10-CM | POA: Insufficient documentation

## 2016-08-01 DIAGNOSIS — L02412 Cutaneous abscess of left axilla: Secondary | ICD-10-CM | POA: Insufficient documentation

## 2016-08-01 DIAGNOSIS — Z79899 Other long term (current) drug therapy: Secondary | ICD-10-CM | POA: Insufficient documentation

## 2016-08-01 NOTE — ED Triage Notes (Signed)
Pt c/o abscesses in left axillary region. Pt feeling stiffness and swelling up towards neck. Pt has had abscesses here but never so bad.

## 2016-08-02 ENCOUNTER — Encounter (HOSPITAL_COMMUNITY): Payer: Self-pay | Admitting: Emergency Medicine

## 2016-08-02 ENCOUNTER — Emergency Department (HOSPITAL_COMMUNITY)
Admission: EM | Admit: 2016-08-02 | Discharge: 2016-08-02 | Disposition: A | Discharge status: Home / Self Care | Payer: Self-pay | Attending: Emergency Medicine | Admitting: Emergency Medicine

## 2016-08-02 DIAGNOSIS — L0291 Cutaneous abscess, unspecified: Secondary | ICD-10-CM

## 2016-08-02 MED ORDER — OXYCODONE-ACETAMINOPHEN 5-325 MG PO TABS
2.0000 | ORAL_TABLET | Freq: Once | ORAL | Status: AC
  Administered 2016-08-02: 2 via ORAL
  Filled 2016-08-02: qty 2

## 2016-08-02 MED ORDER — SULFAMETHOXAZOLE-TRIMETHOPRIM 800-160 MG PO TABS: 1.0000 | ORAL_TABLET | Freq: Two times a day (BID) | ORAL | 0 refills | Status: AC

## 2016-08-02 MED ORDER — LIDOCAINE-EPINEPHRINE (PF) 2 %-1:200000 IJ SOLN
10.0000 mL | Freq: Once | INTRAMUSCULAR | Status: AC
  Filled 2016-08-02: qty 20

## 2016-08-02 MED ORDER — NAPROXEN 500 MG PO TABS: 500.0000 mg | ORAL_TABLET | Freq: Two times a day (BID) | ORAL | 0 refills | Status: DC

## 2016-08-02 MED ORDER — HYDROCODONE-ACETAMINOPHEN 5-325 MG PO TABS: 1.0000 | ORAL_TABLET | Freq: Four times a day (QID) | ORAL | 0 refills | Status: DC | PRN

## 2016-08-02 MED ORDER — BUPIVACAINE HCL (PF) 0.5 % IJ SOLN
10.0000 mL | Freq: Once | INTRAMUSCULAR | Status: AC
  Filled 2016-08-02: qty 30

## 2016-08-02 NOTE — ED Notes (Signed)
Pt ambulatory and independent at discharge.  Verbalized understanding of discharge instructions 

## 2016-08-02 NOTE — ED Provider Notes (Signed)
[facility] DEPT Provider Note  CSN: [id number] Arrival date & time: 08/01/16  2335 By signing my name below, I, Dyke Brackett, attest that this documentation has been prepared under the direction and in the presence of non-physician practitioner, Arlean Hopping, PA-C  Electronically Signed: Dyke Brackett, Scribe. 08/02/2016. 1:19 AM.   History   Chief Complaint Chief Complaint  Patient presents with  . Abscess   HPI Bridget Wilson is a 30 y.o. female who presents to the Emergency Department complaining of three painful, worsening lesions to her left axilla onset 1.5 weeks ago. She rates her pain as 10/10 in severity. She states she has had abscesses before, but has never had three at the same time. No alleviating or modifying factors noted. Pt has been taking her grandmother's antibiotics, but does not know the name. She is not followed by a PCP. Tetanus UTD. Patient denies fever/chills, shortness of breath, chest pain, nausea/vomiting, neuro deficits, or any other complaints.    The history is provided by the patient. No language interpreter was used.   Past Medical History:  Diagnosis Date  . Anxiety    little bit  . Chlamydia     Patient Active Problem List   Diagnosis Date Noted  . Spotting in early pregnancy 06/24/2011    Past Surgical History:  Procedure Laterality Date  . CHOLECYSTECTOMY    . CHOLECYSTECTOMY, LAPAROSCOPIC    . teeth pulled      OB History    Gravida Para Term Preterm AB Living   2 2 2  0 0 2   SAB TAB Ectopic Multiple Live Births   0 0 0 0 1       Home Medications    Prior to Admission medications   Medication Sig Start Date End Date Taking? Authorizing Provider  acetaminophen (TYLENOL) 500 MG tablet Take 1,000 mg by mouth every 6 (six) hours as needed for fever. For pain    Historical Provider, MD  acetaminophen-codeine (TYLENOL #3) 300-30 MG per tablet Take 1 tablet by mouth every 4 (four) hours as needed for moderate pain.     Historical Provider, MD  cephALEXin (KEFLEX) 500 MG capsule Take 1 capsule (500 mg total) by mouth 4 (four) times daily. Patient not taking: Reported on 09/29/2014 09/14/14   Olegario Messier, NP  HYDROcodone-acetaminophen (NORCO/VICODIN) 5-325 MG tablet Take 1-2 tablets by mouth every 6 (six) hours as needed. 08/02/16   Rosalene Wardrop C Kelsey Durflinger, PA-C  naproxen (NAPROSYN) 500 MG tablet Take 1 tablet (500 mg total) by mouth 2 (two) times daily. 03/11/16   Lacretia Leigh, MD  naproxen (NAPROSYN) 500 MG tablet Take 1 tablet (500 mg total) by mouth 2 (two) times daily. 08/02/16   Hubbard Seldon C Sumaiyah Markert, PA-C  norgestimate-ethinyl estradiol (ORTHO-CYCLEN, 28,) 0.25-35 MG-MCG tablet Take 1 tablet by mouth daily. 09/14/14   Olegario Messier, NP  oxyCODONE-acetaminophen (PERCOCET/ROXICET) 5-325 MG per tablet Take 1-2 tablets by mouth every 4 (four) hours as needed for severe pain. 09/30/14   Charlann Lange, PA-C  sulfamethoxazole-trimethoprim (BACTRIM DS,SEPTRA DS) 800-160 MG tablet Take 1 tablet by mouth 2 (two) times daily. 08/02/16 08/09/16  Lorayne Bender, PA-C    Family History Family History  Problem Relation Age of Onset  . Hypertension Mother   . Diabetes Mother   . Anesthesia problems Neg Hx   . Hypotension Neg Hx   . Malignant hyperthermia Neg Hx   . Pseudochol deficiency Neg Hx     Social History Social History  Substance  Use Topics  . Smoking status: Current Every Day Smoker    Packs/day: 0.15    Types: Cigarettes  . Smokeless tobacco: Never Used     Comment: quit 08/12  . Alcohol use Yes     Comment: occ     Allergies   Patient has no known allergies.   Review of Systems Review of Systems  Constitutional: Negative for chills and fever.  Respiratory: Negative for shortness of breath.   Cardiovascular: Negative for chest pain.  Gastrointestinal: Negative for diarrhea, nausea and vomiting.  Skin: Positive for color change.  Neurological: Negative for weakness and numbness.  All other systems reviewed and  are negative.  Physical Exam Updated Vital Signs BP 118/73 (BP Location: Right Arm)   Pulse 94   Temp 98.5 F (36.9 C) (Oral)   Resp 20   Ht 5\' 7"  (1.702 m)   Wt 5 lb 7 oz (2.466 kg)   SpO2 100%   BMI 0.85 kg/m   Physical Exam  Constitutional: She appears well-developed and well-nourished. No distress.  HENT:  Head: Normocephalic and atraumatic.  Eyes: Conjunctivae are normal.  Neck: Normal range of motion. Neck supple.  Cardiovascular: Normal rate, regular rhythm, normal heart sounds and intact distal pulses.   Pulmonary/Chest: Effort normal and breath sounds normal. No respiratory distress.  Abdominal: There is no guarding.  Musculoskeletal: She exhibits no edema.  Full range of motion intact in the left shoulder.  Lymphadenopathy:    She has no cervical adenopathy.  Neurological: She is alert. No sensory deficit.  No sensory deficits in the bilateral upper extremities. Strength 5 out of 5 bilaterally.  Skin: Skin is warm and dry. She is not diaphoretic.  2-3 confluent areas of induration and tenderness in the left axilla.  Psychiatric: She has a normal mood and affect. Her behavior is normal.  Nursing note and vitals reviewed.  ED Treatments / Results  DIAGNOSTIC STUDIES:  Oxygen Saturation is 100% on RA, normal by my interpretation.    COORDINATION OF CARE:  1:16 AM Discussed treatment plan which includes percocet/roxicet with pt at bedside and pt agreed to plan.   Labs (all labs ordered are listed, but only abnormal results are displayed) Labs Reviewed - No data to display  EKG  EKG Interpretation None       Radiology No results found.  Procedures Procedures (including critical care time)  EMERGENCY DEPARTMENT US SOFT TISSUE INTERPRETATION "Study: Limited Ultrasound of the noted body part in comments below"  INDICATIONS: Pain and Soft tissue infection Multiple views of the body part are obtained with a multi-frequency linear probe  PERFORMED BY:   Myself  IMAGES ARCHIVED?: Yes  SIDE:Left  BODY PART:Axilla  FINDINGS: Abcess present and Cellulitis present  LIMITATIONS:  Body Habitus  INTERPRETATION:  Abcess present and Cellulitis present  COMMENT:  Area of fluid collection possibly viewed on [place], but too deep and too near critical structures for comfort with bedside I&D in the ED.   Medications Ordered in ED Medications  oxyCODONE-acetaminophen (PERCOCET/ROXICET) 5-325 MG per tablet 2 tablet (2 tablets Oral Given 08/02/16 0122)  lidocaine-EPINEPHrine (XYLOCAINE W/EPI) 2 %-1:200000 (PF) injection 10 mL (0 mLs Infiltration Return to [facility] 08/02/16 0212)  bupivacaine (MARCAINE) 0.5 % injection 10 mL (0 mLs Infiltration Return to [facility] 08/02/16 0212)     Initial Impression / Assessment and Plan / ED Course  I have reviewed the triage vital signs and the nursing notes.  Pertinent labs & imaging results that were available  during my care of the patient were reviewed by me and considered in my medical decision making (see chart for details).  Clinical Course     Patient presents with axillary abscesses. Small fluid collection noted on ultrasound is too deep and too near vital structures for bedside I&D. Antibiotic therapy and general surgery follow-up. Return precautions discussed.  Vitals:   08/01/16 2343 08/02/16 0212  BP: 118/73 125/76  Pulse: 94 85  Resp: 20 21  Temp: 98.5 F (36.9 C)   TempSrc: Oral   SpO2: 100% 98%  Weight: 2.466 kg   Height: 5\' 7"  (1.702 m)        Final Clinical Impressions(s) / ED Diagnoses   Final diagnoses:  Abscess    New Prescriptions Discharge Medication List as of 08/02/2016  2:02 AM    START taking these medications   Details  HYDROcodone-acetaminophen (NORCO/VICODIN) 5-325 MG tablet Take 1-2 tablets by mouth every 6 (six) hours as needed., Starting Fri 08/02/2016, Print    !! naproxen (NAPROSYN) 500 MG tablet Take 1 tablet (500 mg total) by mouth 2 (two) times daily.,  Starting Fri 08/02/2016, Print    sulfamethoxazole-trimethoprim (BACTRIM DS,SEPTRA DS) 800-160 MG tablet Take 1 tablet by mouth 2 (two) times daily., Starting Fri 08/02/2016, Until Fri 08/09/2016, Print     !! - Potential duplicate medications found. Please discuss with provider.     I personally performed the services described in this documentation, which was scribed in my presence. The recorded information has been reviewed and is accurate.   Lorayne Bender, PA-C 08/02/16 [id number]    Veryl Speak, MD 08/05/16 0700

## 2016-08-02 NOTE — ED Notes (Signed)
ED Provider at bedside. 

## 2016-08-02 NOTE — ED Notes (Signed)
Pt ambulatory to restroom

## 2016-08-02 NOTE — Discharge Instructions (Signed)
You have been seen today for abscesses under the arm. These abscesses were noted to be deeper than can be drained at the bedside. Follow-up with general surgery as soon as possible on this matter. Please take all of your antibiotics until finished!   You may develop abdominal discomfort or diarrhea from the antibiotic.  You may help offset this with probiotics which you can buy or get in yogurt. Do not eat or take the probiotics until 2 hours after your antibiotic. Naproxen or ibuprofen for pain. Vicodin for severe pain. Do not drive or perform other dangerous activities while taking the Vicodin. Follow up with PCP for chronic management of this issue. Return to ED should symptoms worsen.

## 2016-12-05 ENCOUNTER — Inpatient Hospital Stay (HOSPITAL_COMMUNITY)
Admission: AD | Admit: 2016-12-05 | Discharge: 2016-12-05 | Disposition: A | Discharge status: Home / Self Care | Payer: Self-pay | Source: Ambulatory Visit | Attending: Obstetrics & Gynecology | Admitting: Obstetrics & Gynecology

## 2016-12-05 DIAGNOSIS — F17210 Nicotine dependence, cigarettes, uncomplicated: Secondary | ICD-10-CM | POA: Insufficient documentation

## 2016-12-05 DIAGNOSIS — N920 Excessive and frequent menstruation with regular cycle: Secondary | ICD-10-CM | POA: Insufficient documentation

## 2016-12-05 DIAGNOSIS — N939 Abnormal uterine and vaginal bleeding, unspecified: Secondary | ICD-10-CM

## 2016-12-05 LAB — POCT PREGNANCY, URINE: Preg Test, Ur: NEGATIVE

## 2016-12-05 LAB — URINALYSIS, ROUTINE W REFLEX MICROSCOPIC
Bacteria, UA: NONE SEEN
Bilirubin Urine: NEGATIVE
Glucose, UA: NEGATIVE mg/dL
Ketones, ur: NEGATIVE mg/dL
LEUKOCYTES UA: NEGATIVE
NITRITE: NEGATIVE
PROTEIN: NEGATIVE mg/dL
Specific Gravity, Urine: 1.024 (ref 1.005–1.030)
pH: 5 (ref 5.0–8.0)

## 2016-12-05 LAB — CBC
HEMATOCRIT: 37.1 % (ref 36.0–46.0)
HEMOGLOBIN: 12.5 g/dL (ref 12.0–15.0)
MCH: 29.9 pg (ref 26.0–34.0)
MCHC: 33.7 g/dL (ref 30.0–36.0)
MCV: 88.8 fL (ref 78.0–100.0)
Platelets: 389 10*3/uL (ref 150–400)
RBC: 4.18 MIL/uL (ref 3.87–5.11)
RDW: 13.6 % (ref 11.5–15.5)
WBC: 8 10*3/uL (ref 4.0–10.5)

## 2016-12-05 MED ORDER — NORGESTIMATE-ETH ESTRADIOL 0.25-35 MG-MCG PO TABS: ORAL_TABLET | ORAL | 11 refills | Status: DC

## 2016-12-05 NOTE — MAU Note (Signed)
Pt states she has been bleeding for the last 3 months, thought it was due to stress & irregular cycles.  Passed large clots today, bleeding is heavier now.  Denies pain.  Was seen @ [facility] 2 weeks ago, all results were negative.

## 2016-12-05 NOTE — MAU Provider Note (Signed)
Chief Complaint: Vaginal Bleeding   First Provider Initiated Contact with Patient 12/05/16 1942      SUBJECTIVE HPI: Bridget Wilson is a 31 y.o. G2P2002 who presents to maternity admissions reporting vaginal bleeding daily x 3 months with increase this week to heavy menses with large clots.  She is changing a pad or tampon every 2 hours but has bled onto her clothes x 2 this week.  There are no associated symptoms.  She has not tried any treatments.  Nothing makes the bleeding better or worse.  She took OCPs 1 year ago for a heavy period but only took 1 month of pills bc she felt like the bleeding became worse. Periods have been regular and moderate in amount for the last year. She denies vaginal itching/burning, urinary symptoms, h/a, dizziness, n/v, or fever/chills.    She had recent STD testing at [facility] that was negative and declines pelvic exam today.   HPI  Past Medical History:  Diagnosis Date  . Anxiety    little bit  . Chlamydia    Past Surgical History:  Procedure Laterality Date  . CHOLECYSTECTOMY    . CHOLECYSTECTOMY, LAPAROSCOPIC    . teeth pulled     Social History   Social History  . Marital status: Single    Spouse name: N/A  . Number of children: N/A  . Years of education: N/A   Occupational History  . Not on file.   Social History Main Topics  . Smoking status: Current Every Day Smoker    Packs/day: 0.15    Types: Cigarettes  . Smokeless tobacco: Never Used     Comment: quit 08/12  . Alcohol use Yes     Comment: occ  . Drug use: No  . Sexual activity: Yes    Birth control/ protection: Condom, Pill   Other Topics Concern  . Not on file   Social History Narrative  . No narrative on file   No current facility-administered medications on file prior to encounter.    Current Outpatient Prescriptions on File Prior to Encounter  Medication Sig Dispense Refill  . HYDROcodone-acetaminophen (NORCO/VICODIN) 5-325 MG tablet Take 1-2 tablets by mouth  every 6 (six) hours as needed. (Patient not taking: Reported on 12/05/2016) 15 tablet 0  . naproxen (NAPROSYN) 500 MG tablet Take 1 tablet (500 mg total) by mouth 2 (two) times daily. (Patient not taking: Reported on 12/05/2016) 30 tablet 0   No Known Allergies  ROS:  Review of Systems  Constitutional: Negative for chills, fatigue and fever.  Respiratory: Negative for shortness of breath.   Cardiovascular: Negative for chest pain.  Gastrointestinal: Negative for nausea and vomiting.  Genitourinary: Positive for vaginal bleeding. Negative for difficulty urinating, dysuria, flank pain, pelvic pain, vaginal discharge and vaginal pain.  Neurological: Negative for dizziness and headaches.  Psychiatric/Behavioral: Negative.      I have reviewed patient's Past Medical Hx, Surgical Hx, Family Hx, Social Hx, medications and allergies.   Physical Exam  Patient Vitals for the past 24 hrs:  BP Temp Temp src Pulse Resp Height Weight  12/05/16 1722 130/83 98.2 F (36.8 C) Oral (!) 106 18 5\' 7"  (1.702 m) 273 lb (123.8 kg)   Constitutional: Well-developed, well-nourished female in no acute distress.  Cardiovascular: normal rate Respiratory: normal effort GI: Abd soft, non-tender. Pos BS x 4 MS: Extremities nontender, no edema, normal ROM Neurologic: Alert and oriented x 4.  GU: Neg CVAT.  PELVIC EXAM: Declined by pt  LAB RESULTS Results for orders placed or performed during the hospital encounter of 12/05/16 (from the past 24 hour(s))  Urinalysis, Routine w reflex microscopic     Status: Abnormal   Collection Time: 12/05/16  5:27 PM  Result Value Ref Range   Color, Urine YELLOW YELLOW   APPearance CLEAR CLEAR   Specific Gravity, Urine 1.024 1.005 - 1.030   pH 5.0 5.0 - 8.0   Glucose, UA NEGATIVE NEGATIVE mg/dL   Hgb urine dipstick LARGE (A) NEGATIVE   Bilirubin Urine NEGATIVE NEGATIVE   Ketones, ur NEGATIVE NEGATIVE mg/dL   Protein, ur NEGATIVE NEGATIVE mg/dL   Nitrite NEGATIVE  NEGATIVE   Leukocytes, UA NEGATIVE NEGATIVE   RBC / HPF TOO NUMEROUS TO COUNT 0 - 5 RBC/hpf   WBC, UA 0-5 0 - 5 WBC/hpf   Bacteria, UA NONE SEEN NONE SEEN   Squamous Epithelial / LPF 0-5 (A) NONE SEEN   Mucous PRESENT   Pregnancy, urine POC     Status: None   Collection Time: 12/05/16  6:03 PM  Result Value Ref Range   Preg Test, Ur NEGATIVE NEGATIVE  CBC     Status: None   Collection Time: 12/05/16  6:30 PM  Result Value Ref Range   WBC 8.0 4.0 - 10.5 K/uL   RBC 4.18 3.87 - 5.11 MIL/uL   Hemoglobin 12.5 12.0 - 15.0 g/dL   HCT 37.1 36.0 - 46.0 %   MCV 88.8 78.0 - 100.0 fL   MCH 29.9 26.0 - 34.0 pg   MCHC 33.7 30.0 - 36.0 g/dL   RDW 13.6 11.5 - 15.5 %   Platelets 389 150 - 400 K/uL       IMAGING No results found.  MAU Management/MDM: Ordered labs and reviewed results. Hgb stable. Will treat heavy bleeding with OCPs, with pt to take double dose x 4-5 days, then 1 tablet daily and continue daily OCPs with next pack.  Outpatient [place] ordered and pt to f/u in [place].  Bleeding precautions reviewed.   Pt stable at time of discharge.  ASSESSMENT 1. Abnormal uterine bleeding (AUB)     PLAN Discharge home Allergies as of 12/05/2016   No Known Allergies     Medication List    STOP taking these medications   HYDROcodone-acetaminophen 5-325 MG tablet Commonly known as:  NORCO/VICODIN     TAKE these medications   naproxen 500 MG tablet Commonly known as:  NAPROSYN Take 1 tablet (500 mg total) by mouth 2 (two) times daily.   norgestimate-ethinyl estradiol 0.25-35 MG-MCG tablet Commonly known as:  ORTHO-CYCLEN,SPRINTEC,PREVIFEM Take 2 tabs per day x4-5 days, then 1 pill daily for first pack.      Follow-up [facility] for [place] Follow up.   Specialty:  Obstetrics and Gynecology Why:  The office will call you with appointment. Return to MAU as needed for Gyn emergencies. Contact information: [place]  [place] Certified Nurse-Midwife 12/05/2016  8:36 PM

## 2016-12-05 NOTE — Discharge Instructions (Signed)
Abnormal Uterine Bleeding Abnormal uterine bleeding can affect women at various stages in life, including teenagers, women in their reproductive years, pregnant women, and women who have reached menopause. Several kinds of uterine bleeding are considered abnormal, including:  Bleeding or spotting between periods.  Bleeding after sexual intercourse.  Bleeding that is heavier or more than normal.  Periods that last longer than usual.  Bleeding after menopause. Many cases of abnormal uterine bleeding are minor and simple to treat, while others are more serious. Any type of abnormal bleeding should be evaluated by your health care provider. Treatment will depend on the cause of the bleeding. Follow these instructions at home: Monitor your condition for any changes. The following actions may help to alleviate any discomfort you are experiencing:  Avoid the use of tampons and douches as directed by your health care provider.  Change your pads frequently. You should get regular pelvic exams and Pap tests. Keep all follow-up appointments for diagnostic tests as directed by your health care provider. Contact a health care provider if:  Your bleeding lasts more than 1 week.  You feel dizzy at times. Get help right away if:  You pass out.  You are changing pads every 15 to 30 minutes.  You have abdominal pain.  You have a fever.  You become sweaty or weak.  You are passing large blood clots from the vagina.  You start to feel nauseous and vomit. This information is not intended to replace advice given to you by your health care provider. Make sure you discuss any questions you have with your health care provider. Document Released: 08/19/2005 Document Revised: 01/31/2016 Document Reviewed: 03/18/2013 Elsevier Interactive Patient Education  2017 Elsevier Inc.  

## 2016-12-13 ENCOUNTER — Ambulatory Visit (HOSPITAL_COMMUNITY): Payer: Self-pay

## 2016-12-18 ENCOUNTER — Ambulatory Visit (HOSPITAL_COMMUNITY)
Admission: RE | Admit: 2016-12-18 | Discharge: 2016-12-18 | Disposition: A | Discharge status: Home / Self Care | Payer: Self-pay | Source: Ambulatory Visit | Attending: Advanced Practice Midwife | Admitting: Advanced Practice Midwife

## 2016-12-18 DIAGNOSIS — N939 Abnormal uterine and vaginal bleeding, unspecified: Secondary | ICD-10-CM | POA: Insufficient documentation

## 2016-12-18 DIAGNOSIS — D251 Intramural leiomyoma of uterus: Secondary | ICD-10-CM | POA: Insufficient documentation

## 2016-12-18 DIAGNOSIS — N83291 Other ovarian cyst, right side: Secondary | ICD-10-CM | POA: Insufficient documentation

## 2016-12-19 ENCOUNTER — Telehealth: Payer: Self-pay | Admitting: General Practice

## 2016-12-19 MED ORDER — MEGESTROL ACETATE 40 MG PO TABS: 40.0000 mg | ORAL_TABLET | Freq: Two times a day (BID) | ORAL | 1 refills | Status: DC

## 2016-12-19 NOTE — Telephone Encounter (Signed)
Called patient and she is very upset in regards to her constant bleeding which has worsened. Patient states she has been taking the medication as prescribed and it hasn't helped at all. Patient states now if she bends over or coughs or sneezes she has to immediately change her pad. Patient reports continued clotting. Discussed with Maye Hides who states we can prescribe megace 40 mg bid for patient until she can get in for office follow up. Discussed with patient. Patient verbalized understanding and states that she feels like she is being treated different because she doesn't have insurance. Patient states she comes in and has to see a midwife & then she goes months of getting STD tests then come to find out she's got this fibroid causing all her bleeding and she is just sick of bleeding and wants it to stop. Patient repeats that she feels like she is being treated different because of her lack of insurance. Discussed with patient that we are not treating her different than any other patient, with or without insurance and I'm sorry she feels that way. Discussed that our midwives are capable and trained to take care of all patients not just pregnant patients. Discussed that we are changing her medication to help stop her bleeding and that the size of the fibroid she has is unlikely to cause bleeding like this. Recommended she follow up with a provider in our office as well to discuss her bleeding and long term management. Told patient someone from our front office will contact her with an appointment and that if the medication doesn't help her bleeding in a couple days she should call back and let us know. Patient verbalized understanding to all and states she is just tired of bleeding and wants it to stop. Patient had no further questions and seemed slightly calmer than initial encounter.

## 2017-01-08 ENCOUNTER — Encounter: Payer: Self-pay | Admitting: Obstetrics and Gynecology

## 2017-01-08 ENCOUNTER — Ambulatory Visit (INDEPENDENT_AMBULATORY_CARE_PROVIDER_SITE_OTHER): Payer: Self-pay | Admitting: Obstetrics and Gynecology

## 2017-01-08 VITALS — BP 130/69 | HR 75 | Ht 67.0 in | Wt 278.7 lb

## 2017-01-08 DIAGNOSIS — Z111 Encounter for screening for respiratory tuberculosis: Secondary | ICD-10-CM

## 2017-01-08 DIAGNOSIS — N938 Other specified abnormal uterine and vaginal bleeding: Secondary | ICD-10-CM

## 2017-01-08 MED ORDER — MEGESTROL ACETATE 40 MG PO TABS: 40.0000 mg | ORAL_TABLET | Freq: Two times a day (BID) | ORAL | 1 refills | Status: AC

## 2017-01-08 MED ORDER — TUBERCULIN PPD 5 UNIT/0.1ML ID SOLN
5.0000 [IU] | Freq: Once | INTRADERMAL | Status: AC
  Administered 2017-01-08: 5 [IU] via INTRADERMAL

## 2017-01-08 MED ORDER — NORETHIN-ETH ESTRADIOL-FE 0.4-35 MG-MCG PO CHEW: CHEWABLE_TABLET | ORAL | 11 refills | Status: DC

## 2017-01-08 MED ORDER — NORETHIN-ETH ESTRADIOL-FE 0.4-35 MG-MCG PO CHEW: 1.0000 | CHEWABLE_TABLET | Freq: Every day | ORAL | 11 refills | Status: DC

## 2017-01-08 NOTE — Progress Notes (Signed)
31 yo G2P2 presenting today as an MAU follow up for the evaluation of DUB. Patient reports a history of normal menses with vaginal bleeding lasting 7 days monthly. In January, her period started as expected but never stopped until initiation of OCP and Megace in April. She describes the flow as heavy at times with passage of clots. She denies chest pain, SOB or lightheadedness/dizziness. Patient is not currently sexually active. She reports negative STD screen with health department. She states that her vaginal bleeding has stopped on her current regimen but she admits to being moody and gaining weight.  Past Medical History:  Diagnosis Date  . Anxiety    little bit  . Chlamydia    Past Surgical History:  Procedure Laterality Date  . CHOLECYSTECTOMY    . CHOLECYSTECTOMY, LAPAROSCOPIC    . teeth pulled     Family History  Problem Relation Age of Onset  . Hypertension Mother   . Diabetes Mother   . Anesthesia problems Neg Hx   . Hypotension Neg Hx   . Malignant hyperthermia Neg Hx   . Pseudochol deficiency Neg Hx    Social History  Substance Use Topics  . Smoking status: Current Every Day Smoker    Packs/day: 0.15    Types: Cigarettes  . Smokeless tobacco: Never Used     Comment: quit 08/12  . Alcohol use Yes     Comment: occ   ROS See pertinent in HPI  Blood pressure 130/69, pulse 75, height 5\' 7"  (1.702 m), weight 278 lb 11.2 oz (126.4 kg). GENERAL: Well-developed, well-nourished female in no acute distress.  ABDOMEN: Soft, nontender, nondistended. Obese PELVIC: Normal external female genitalia. Vagina is pink and rugated.  Normal discharge. Normal appearing cervix. Uterus is normal in size. No adnexal mass or tenderness. EXTREMITIES: No cyanosis, clubbing, or edema, 2+ distal pulses.  April 2018 ultrasound FINDINGS: Uterus  Measurements: 9.6 x 6.5 x 6.8 cm. 1.8 x 1.1 x 1.5 cm intramural fibroid in the right posterior uterine body.  Endometrium  Thickness: 16 mm.   No focal abnormality visualized.  Right ovary  Measurements: 5.2 x 3.0 x 2.5 cm. Two simple cysts measuring up to 2.5 cm, physiologic.  Left ovary  Measurements: 2.7 x 1.8 x 1.5 cm. Normal appearance/no adnexal mass.  Other findings  No abnormal free fluid.  IMPRESSION: 1.8 cm intramural fibroid in the right posterior uterine body.  Otherwise negative pelvic ultrasound.   A/P 31 yo with DUB - Discussed medical management with either progesterone only or OCP only. Patient to stop OCP after completing pack and continue with Megace. If vaginal bleeding remains well controlled. Patient may return for IUD. If vaginal bleeding is not well controlled, patient to start OCP taper followed by regulat OCP pack - Financial assistance packet provided - RTC prn - Patient reports normal annual exam with health department in early 2018

## 2017-01-08 NOTE — Progress Notes (Signed)
Pt is currently on megace 40mg  bid and sprintec. Feels very moody, and has gained weight. Bleeding has stopped.

## 2017-01-10 ENCOUNTER — Ambulatory Visit: Payer: Self-pay

## 2017-01-10 DIAGNOSIS — Z111 Encounter for screening for respiratory tuberculosis: Secondary | ICD-10-CM

## 2017-01-10 NOTE — Progress Notes (Signed)
Pt here today for PPD test reading on  L forearm.  0 mm induration resulted.  Letter given to pt for negative PPD test.  Pt stated that she was not aware of the need today for the IUD application but continued to express interest in receiving the IUD.  Pt given Mirena application and advised to bring completed application along with required income documents, a nurse will look over before faxing when pt brings back to the office.  Pt stated understanding with no further questions.

## 2017-03-14 ENCOUNTER — Telehealth: Payer: Self-pay | Admitting: *Deleted

## 2017-03-14 DIAGNOSIS — N938 Other specified abnormal uterine and vaginal bleeding: Secondary | ICD-10-CM

## 2017-03-14 NOTE — Telephone Encounter (Signed)
Pt left message stating that she was given a new Rx for birth control however it costs $75. She wants to know if there is a generic available or other Rx since her current birth control is not working for her.

## 2017-03-19 MED ORDER — NORETHINDRONE-ETH ESTRADIOL 1-35 MG-MCG PO TABS: ORAL_TABLET | ORAL | 11 refills | Status: AC

## 2017-03-19 NOTE — Telephone Encounter (Signed)
Patient called into front office stating she was never able to fill Rx for femcon due to price. Patient states she has been taking megace BID AND sprintec and is still bleeding. Told patient I will speak with one of our providers and call her back regarding her medication. Patient verbalized understanding & had no questions. Spoke with Dr Rip Harbour regarding patient who states patient may stop sprintec and begin Megace TID then come in for follow up OR patient may take pirmella 1/35 on previously recommended taper then stop megace once taper is finished.  1103am Called patient, no answer- left message stating I am trying to reach you concerning medication changes please call me back

## 2017-03-19 NOTE — Telephone Encounter (Signed)
Patient called back into office and I informed her of medication options. Patient elects cheaper OCP with taper. Told patient I will send new prescription to pharmacy & provided her instructions. Patient verbalized understanding & had no questions

## 2017-04-29 ENCOUNTER — Encounter (HOSPITAL_COMMUNITY): Payer: Self-pay | Admitting: Emergency Medicine

## 2017-04-29 ENCOUNTER — Ambulatory Visit (HOSPITAL_COMMUNITY)
Admission: EM | Admit: 2017-04-29 | Discharge: 2017-04-29 | Disposition: A | Discharge status: Home / Self Care | Payer: Self-pay | Attending: Emergency Medicine | Admitting: Emergency Medicine

## 2017-04-29 DIAGNOSIS — K5900 Constipation, unspecified: Secondary | ICD-10-CM | POA: Insufficient documentation

## 2017-04-29 DIAGNOSIS — K649 Unspecified hemorrhoids: Secondary | ICD-10-CM | POA: Insufficient documentation

## 2017-04-29 DIAGNOSIS — Z3202 Encounter for pregnancy test, result negative: Secondary | ICD-10-CM

## 2017-04-29 DIAGNOSIS — L989 Disorder of the skin and subcutaneous tissue, unspecified: Secondary | ICD-10-CM

## 2017-04-29 LAB — POCT PREGNANCY, URINE: PREG TEST UR: NEGATIVE

## 2017-04-29 MED ORDER — HYDROCODONE-ACETAMINOPHEN 5-325 MG PO TABS: 1.0000 | ORAL_TABLET | ORAL | 0 refills | Status: DC | PRN

## 2017-04-29 MED ORDER — LIDOCAINE VISCOUS 2 % MT SOLN: OROMUCOSAL | 0 refills | Status: DC

## 2017-04-29 MED ORDER — VALACYCLOVIR HCL 1 G PO TABS
1000.0000 mg | ORAL_TABLET | Freq: Three times a day (TID) | ORAL | 0 refills | Status: DC
Start: 2017-04-29 — End: 2017-05-21

## 2017-04-29 NOTE — ED Triage Notes (Signed)
PT reports rectal pain for 2 days. PT reports no history of hemerhoids. PT does not see anything protruding. PT reports it hurts to sit and she has been avoiding BMs for 2 days.

## 2017-04-29 NOTE — ED Provider Notes (Signed)
[place]    CSN: [id number] Arrival date & time: 04/29/17  1742     History   Chief Complaint Chief Complaint  Patient presents with  . Hemorrhoids    HPI MONTEEN TOOPS is a 31 y.o. female.   31 year old obese female presents with rectal pain for the past 3-4 days. She believes it is hemorrhoids. She states she has had some constipation and has been straining. With each bowel movement she has pain. Although the pain is external. She states that she had anal sex several days prior to the onset of the discomfort.      Past Medical History:  Diagnosis Date  . Anxiety    little bit  . Chlamydia     Patient Active Problem List   Diagnosis Date Noted  . Abnormal uterine bleeding (AUB) 12/05/2016    Past Surgical History:  Procedure Laterality Date  . CHOLECYSTECTOMY    . CHOLECYSTECTOMY, LAPAROSCOPIC    . teeth pulled      OB History    Gravida Para Term Preterm AB Living   2 2 2  0 0 2   SAB TAB Ectopic Multiple Live Births   0 0 0 0 1       Home Medications    Prior to Admission medications   Medication Sig Start Date End Date Taking? Authorizing Provider  norethindrone-ethinyl estradiol 1/35 (ORTHO-NOVUM, NORTREL,CYCLAFEM) tablet Take 3 tabs x 3 days, 2 tabs x 3 day then 1 tab daily. Skip placebo pills & start new pack taking 1 tab daily 03/19/17  Yes Chancy Milroy, MD  HYDROcodone-acetaminophen (NORCO/VICODIN) 5-325 MG tablet Take 1 tablet by mouth every 4 (four) hours as needed. 04/29/17   Janne Napoleon, NP  lidocaine (XYLOCAINE) 2 % solution Apply a small amount to the painful sores of the rectum and perineum every 2-3 hours as needed. 04/29/17   Janne Napoleon, NP  megestrol (MEGACE) 40 MG tablet Take 1 tablet (40 mg total) by mouth 2 (two) times daily. 01/08/17   Constant, Peggy, MD  valACYclovir (VALTREX) 1000 MG tablet Take 1 tablet (1,000 mg total) by mouth 3 (three) times daily. 04/29/17   Janne Napoleon, NP    Family History Family  History  Problem Relation Age of Onset  . Hypertension Mother   . Diabetes Mother   . Anesthesia problems Neg Hx   . Hypotension Neg Hx   . Malignant hyperthermia Neg Hx   . Pseudochol deficiency Neg Hx     Social History Social History  Substance Use Topics  . Smoking status: Current Every Day Smoker    Packs/day: 0.15    Types: Cigarettes  . Smokeless tobacco: Never Used     Comment: quit 08/12  . Alcohol use Yes     Comment: sometimes     Allergies   Patient has no known allergies.   Review of Systems Review of Systems  Constitutional: Negative.   Respiratory: Negative.   Gastrointestinal: Positive for constipation and rectal pain.  All other systems reviewed and are negative.    Physical Exam Triage Vital Signs ED Triage Vitals  Enc Vitals Group     BP 04/29/17 1845 (!) 142/96     Pulse Rate 04/29/17 1845 (!) 115     Resp 04/29/17 1845 16     Temp 04/29/17 1845 97.6 F (36.4 C)     Temp Source 04/29/17 1845 Temporal     SpO2 04/29/17 1845 99 %     Weight  04/29/17 1846 275 lb (124.7 kg)     Height 04/29/17 1846 5\' 7"  (1.702 m)     Head Circumference --      Peak Flow --      Pain Score 04/29/17 1847 10     Pain Loc --      Pain Edu? --      Excl. in [place]? --    No data found.   Updated Vital Signs BP (!) 142/96   Pulse (!) 115   Temp 97.6 F (36.4 C) (Temporal)   Resp 16   Ht 5\' 7"  (1.702 m)   Wt 275 lb (124.7 kg)   LMP 03/16/2017   SpO2 99%   BMI 43.07 kg/m   Visual Acuity Right Eye Distance:   Left Eye Distance:   Bilateral Distance:    Right Eye Near:   Left Eye Near:    Bilateral Near:     Physical Exam  Constitutional: She is oriented to person, place, and time. She appears well-developed and well-nourished.  Neck: Neck supple.  Pulmonary/Chest: Effort normal.  Genitourinary:  Genitourinary Comments: [place], rad tech present. Examination of the anus and perineum reveals several oval 2 annular skin lesions which appear as  ulcerative and exquisitely tender. There are no current vesicles at this time. These are involving the tissues of the anus and perineum. No evidence of hemorrhoids. No bleeding from the rectum.  Neurological: She is alert and oriented to person, place, and time.  Skin: Skin is warm and dry.  Nursing note and vitals reviewed.    UC Treatments / Results  Labs (all labs ordered are listed, but only abnormal results are displayed) Labs Reviewed  HSV CULTURE AND TYPING  POCT PREGNANCY, URINE    EKG  EKG Interpretation None       Radiology No results found.  Procedures Procedures (including critical care time)  Medications Ordered in UC Medications - No data to display   Initial Impression / Assessment and Plan / UC Course  I have reviewed the triage vital signs and the nursing notes.  Pertinent labs & imaging results that were available during my care of the patient were reviewed by me and considered in my medical decision making (see chart for details).     Suspect some sort of viral reason for these lesions. Have to presume this may be due to HSV or genital type herpes at this time. A swab has been obtained to test this. In the meantime apply the Viscous Xylocaine every couple of hours as needed for pain. Take your pain medication and the antiviral medication as directed.   Final Clinical Impressions(s) / UC Diagnoses   Final diagnoses:  Skin lesions    New Prescriptions New Prescriptions   HYDROCODONE-ACETAMINOPHEN (NORCO/VICODIN) 5-325 MG TABLET    Take 1 tablet by mouth every 4 (four) hours as needed.   LIDOCAINE (XYLOCAINE) 2 % SOLUTION    Apply a small amount to the painful sores of the rectum and perineum every 2-3 hours as needed.   VALACYCLOVIR (VALTREX) 1000 MG TABLET    Take 1 tablet (1,000 mg total) by mouth 3 (three) times daily.     Controlled Substance Prescriptions [place] Controlled Substance Registry consulted? No, Unable to connect   Janne Napoleon,  NP 04/29/17 2018

## 2017-04-29 NOTE — Discharge Instructions (Signed)
Suspect some sort of viral reason for these lesions. Have to presume this may be due to HSV or genital type herpes at this time. A swab has been obtained to test this. In the meantime apply the Viscous Xylocaine every couple of hours as needed for pain. Take your pain medication and the antiviral medication as directed.

## 2017-05-02 ENCOUNTER — Telehealth (HOSPITAL_COMMUNITY): Payer: Self-pay | Admitting: Emergency Medicine

## 2017-05-02 NOTE — Telephone Encounter (Signed)
Pharmacy called and wanted to know if we could change Valtrex to acycolvir... Sts pt can't afford Valtrex.   Per Ruta Hinds, NP... Ok to change to acyclovir 400 mg TID x10 days #30.

## 2017-05-03 LAB — HSV CULTURE AND TYPING

## 2017-05-19 ENCOUNTER — Telehealth: Payer: Self-pay | Admitting: Family Medicine

## 2017-05-19 NOTE — Telephone Encounter (Signed)
Patient want to discuss medication

## 2017-05-19 NOTE — Telephone Encounter (Signed)
Pt called and stated that she went to Urgent Care in which she was told that she had an outbreak.  Pt wants to get provider approval for medication?

## 2017-05-21 MED ORDER — VALACYCLOVIR HCL 1 G PO TABS: 1000.0000 mg | ORAL_TABLET | Freq: Three times a day (TID) | ORAL | 1 refills | Status: DC

## 2017-05-21 NOTE — Telephone Encounter (Signed)
Pt called and stated that she is currently having another outbreak and would like a refill on her Valtrex.  Notified Dr. Ilda Basset, who agreed to refill Valtrex 1000 mg po tid for 7 days with refill.  Informed pt that the Rx she requested has been sent to her [facility] off [address].  Pt stated thank you with no further questions.

## 2017-05-26 ENCOUNTER — Telehealth: Payer: Self-pay | Admitting: General Practice

## 2017-05-26 DIAGNOSIS — B009 Herpesviral infection, unspecified: Secondary | ICD-10-CM

## 2017-05-26 MED ORDER — ACYCLOVIR 800 MG PO TABS: 800.0000 mg | ORAL_TABLET | Freq: Two times a day (BID) | ORAL | 0 refills | Status: DC

## 2017-05-26 NOTE — Telephone Encounter (Signed)
Patient called and left message stating she needs a different medication prescribed that isn't $180. Spoke with Dr Roselie Awkward, may prescribe acyclovir 800mg  BID x 5 days. Called patient- no answer. Left message stating we are trying to reach you to return your phone call. We have received your phone message and have sent a cheaper prescription to your pharmacy for you to pick up. You may call us back if you have questions

## 2017-07-16 ENCOUNTER — Other Ambulatory Visit: Payer: Self-pay | Admitting: Obstetrics & Gynecology

## 2017-07-16 DIAGNOSIS — B009 Herpesviral infection, unspecified: Secondary | ICD-10-CM

## 2017-07-20 ENCOUNTER — Telehealth: Payer: Self-pay

## 2017-07-20 NOTE — Telephone Encounter (Signed)
Pt left message on nurse voicemail on 07/17/17 at 9:34am, requesting a refill on Acyclovir 800 mg to be sent to [facility] on [address], pt callback number is [phone]

## 2017-07-21 MED ORDER — ACYCLOVIR 800 MG PO TABS: 800.0000 mg | ORAL_TABLET | Freq: Two times a day (BID) | ORAL | 5 refills | Status: DC

## 2017-07-21 NOTE — Telephone Encounter (Signed)
Refills obtained via Dr Ihor Dow. Called patient, no answer- left message stating we are trying to reach you to return your phone call. We have sent in several refills to your [place].

## 2017-07-22 ENCOUNTER — Other Ambulatory Visit: Payer: Self-pay | Admitting: General Practice

## 2017-07-22 DIAGNOSIS — A6000 Herpesviral infection of urogenital system, unspecified: Secondary | ICD-10-CM

## 2017-07-22 MED ORDER — ACYCLOVIR 800 MG PO TABS: 800.0000 mg | ORAL_TABLET | Freq: Two times a day (BID) | ORAL | 5 refills | Status: AC

## 2017-10-27 ENCOUNTER — Encounter (HOSPITAL_COMMUNITY): Payer: Self-pay

## 2017-10-27 ENCOUNTER — Other Ambulatory Visit: Payer: Self-pay

## 2017-10-27 ENCOUNTER — Emergency Department (HOSPITAL_COMMUNITY)
Admission: EM | Admit: 2017-10-27 | Discharge: 2017-10-27 | Disposition: A | Discharge status: Home / Self Care | Payer: Self-pay | Attending: Emergency Medicine | Admitting: Emergency Medicine

## 2017-10-27 DIAGNOSIS — H6092 Unspecified otitis externa, left ear: Secondary | ICD-10-CM | POA: Insufficient documentation

## 2017-10-27 DIAGNOSIS — K0889 Other specified disorders of teeth and supporting structures: Secondary | ICD-10-CM | POA: Insufficient documentation

## 2017-10-27 DIAGNOSIS — K029 Dental caries, unspecified: Secondary | ICD-10-CM

## 2017-10-27 DIAGNOSIS — F17210 Nicotine dependence, cigarettes, uncomplicated: Secondary | ICD-10-CM | POA: Insufficient documentation

## 2017-10-27 DIAGNOSIS — H60392 Other infective otitis externa, left ear: Secondary | ICD-10-CM

## 2017-10-27 MED ORDER — TRAMADOL HCL 50 MG PO TABS
50.0000 mg | ORAL_TABLET | Freq: Once | ORAL | Status: AC
  Administered 2017-10-27: 50 mg via ORAL
  Filled 2017-10-27: qty 1

## 2017-10-27 MED ORDER — IBUPROFEN 800 MG PO TABS
800.0000 mg | ORAL_TABLET | Freq: Once | ORAL | Status: AC
  Administered 2017-10-27: 800 mg via ORAL
  Filled 2017-10-27: qty 1

## 2017-10-27 MED ORDER — AMOXICILLIN 500 MG PO CAPS
500.0000 mg | ORAL_CAPSULE | Freq: Once | ORAL | Status: AC
  Administered 2017-10-27: 500 mg via ORAL
  Filled 2017-10-27: qty 1

## 2017-10-27 MED ORDER — TRAMADOL HCL 50 MG PO TABS: 50.0000 mg | ORAL_TABLET | Freq: Three times a day (TID) | ORAL | 0 refills | Status: DC | PRN

## 2017-10-27 MED ORDER — CIPROFLOXACIN-DEXAMETHASONE 0.3-0.1 % OT SUSP
4.0000 [drp] | Freq: Once | OTIC | Status: AC
  Administered 2017-10-27: 4 [drp] via OTIC
  Filled 2017-10-27: qty 7.5

## 2017-10-27 MED ORDER — AMOXICILLIN 500 MG PO CAPS: 500.0000 mg | ORAL_CAPSULE | Freq: Three times a day (TID) | ORAL | 0 refills | Status: DC

## 2017-10-27 NOTE — ED Provider Notes (Signed)
TIME SEEN: 2:10 AM  CHIEF COMPLAINT: Right lower dental pain, left ear pain  HPI: Patient is a 32 year old female who presents to the emergency department with 2 separate complaints.  She is complaining of right lower dental pain that started 3 days ago.  Does not have a dentist.  No facial swelling.  No fever.  Also reports that a day ago she started having left ear pain with clear drainage.  No injury to her head or ear that she can recall.  She has had cough.  No sore throat.  Did not have an influenza vaccination. Is not diabetic.  ROS: See HPI Constitutional: no fever  Eyes: no drainage  ENT: no runny nose   Cardiovascular:  no chest pain  Resp: no SOB  GI: no vomiting GU: no dysuria Integumentary: no rash  Allergy: no hives  Musculoskeletal: no leg swelling  Neurological: no slurred speech ROS otherwise negative  PAST MEDICAL HISTORY/PAST SURGICAL HISTORY:  Past Medical History:  Diagnosis Date  . Anxiety    little bit  . Chlamydia     MEDICATIONS:  Prior to Admission medications   Medication Sig Start Date End Date Taking? Authorizing Provider  acyclovir (ZOVIRAX) 800 MG tablet take 1 tablet by mouth twice a day 08/01/17   Woodroe Mode, MD  HYDROcodone-acetaminophen (NORCO/VICODIN) 5-325 MG tablet Take 1 tablet by mouth every 4 (four) hours as needed. 04/29/17   Janne Napoleon, NP  lidocaine (XYLOCAINE) 2 % solution Apply a small amount to the painful sores of the rectum and perineum every 2-3 hours as needed. 04/29/17   Janne Napoleon, NP  megestrol (MEGACE) 40 MG tablet Take 1 tablet (40 mg total) by mouth 2 (two) times daily. 01/08/17   Constant, Peggy, MD  norethindrone-ethinyl estradiol 1/35 (ORTHO-NOVUM, NORTREL,CYCLAFEM) tablet Take 3 tabs x 3 days, 2 tabs x 3 day then 1 tab daily. Skip placebo pills & start new pack taking 1 tab daily 03/19/17   Chancy Milroy, MD    ALLERGIES:  No Known Allergies  SOCIAL HISTORY:  Social History   Tobacco Use  . Smoking status:  Current Every Day Smoker    Packs/day: 0.15    Types: Cigarettes  . Smokeless tobacco: Never Used  . Tobacco comment: quit 08/12  Substance Use Topics  . Alcohol use: Yes    Comment: sometimes    FAMILY HISTORY: Family History  Problem Relation Age of Onset  . Hypertension Mother   . Diabetes Mother   . Anesthesia problems Neg Hx   . Hypotension Neg Hx   . Malignant hyperthermia Neg Hx   . Pseudochol deficiency Neg Hx     EXAM: BP (!) 138/93 (BP Location: Right Arm)   Pulse 89   Temp 97.8 F (36.6 C) (Oral)   Resp 18   SpO2 98%  CONSTITUTIONAL: Alert and oriented and responds appropriately to questions. Well-appearing; well-nourished HEAD: Normocephalic EYES: Conjunctivae clear, pupils appear equal, EOMI ENT: normal nose; moist mucous membranes; No pharyngeal erythema or petechiae, no tonsillar hypertrophy or exudate, no uvular deviation, no unilateral swelling, no trismus or drooling, no muffled voice, normal phonation, no stridor, + dental caries present, patient has some mild decay noted to the outer aspect of the second right lower molar and this is where she is having pain, no drainable dental abscess noted, no Ludwig's angina, tongue sits flat in the bottom of the mouth, no angioedema, no facial erythema or warmth, no facial swelling; no pain with movement of  the neck.  TMs are clear bilaterally without erythema, purulence, bulging, perforation, effusion.  No cerumen impaction or sign of foreign body in the external auditory canal. No inflammation, erythema or drainage from the external auditory canal on the right side.  The left external auditory canal is inflamed, erythematous with white discharge and no blood or purulence. No signs of mastoiditis. No pain with manipulation of the pinna bilaterally. NECK: Supple, no meningismus, no nuchal rigidity, no LAD  CARD: RRR; S1 and S2 appreciated; no murmurs, no clicks, no rubs, no gallops RESP: Normal chest excursion without  splinting or tachypnea; breath sounds clear and equal bilaterally; no wheezes, no rhonchi, no rales, no hypoxia or respiratory distress, speaking full sentences ABD/GI: Normal bowel sounds; non-distended; soft, non-tender, no rebound, no guarding, no peritoneal signs, no hepatosplenomegaly BACK:  The back appears normal and is non-tender to palpation, there is no CVA tenderness EXT: Normal ROM in all joints; non-tender to palpation; no edema; normal capillary refill; no cyanosis, no calf tenderness or swelling    SKIN: Normal color for age and race; warm; no rash NEURO: Moves all extremities equally PSYCH: The patient's mood and manner are appropriate. Grooming and personal hygiene are appropriate.  MEDICAL DECISION MAKING: Patient here with dental pain likely from dental caries.  Will start her on prophylactic amoxicillin and give her dental follow-up.  No sign of Ludwig angina.  No facial cellulitis.  Also complaining of left ear pain.  She appears to have a left otitis externa.  We will give her Ciprodex drops from the emergency department.  I recommend alternating Tylenol and Motrin and will discharge with short course of tramadol for pain control.  Given outpatient PCP and ENT follow-up.  Discussed return precautions.   At this time, I do not feel there is any life-threatening condition present. I have reviewed and discussed all results (EKG, imaging, lab, urine as appropriate) and exam findings with patient/family. I have reviewed nursing notes and appropriate previous records.  I feel the patient is safe to be discharged home without further emergent workup and can continue workup as an outpatient as needed. Discussed usual and customary return precautions. Patient/family verbalize understanding and are comfortable with this plan.  Outpatient follow-up has been provided if needed. All questions have been answered.      Joye Wesenberg, Delice Bison, DO 10/27/17 [phone]

## 2017-10-27 NOTE — ED Triage Notes (Signed)
Pt reports tooth on the R lower side for the past two days and a L sided ear ache today with popping.

## 2017-10-27 NOTE — Discharge Instructions (Signed)
You may alternate Tylenol 1000 mg every 6 hours as needed for pain and Ibuprofen 800 mg every 8 hours as needed for pain.  Please take Ibuprofen with food.  Please use your eardrops in the left ear for the next 10 days.  Please place 4 drops in the left ear twice a day for 10 days.    To find a primary care or specialty doctor please call [phone] or [phone] to access "[facility] Doctor Service."  You may also go on the [facility] website at [email or web address]  There are also multiple [facility], [facility], [facility] and [facility] practices throughout the [place] that are frequently accepting new patients. You may find a clinic that is close to your home and contact them.  [place] [address] [place]  [place] [address] [place] [phone]

## 2018-06-05 ENCOUNTER — Telehealth: Payer: Self-pay | Admitting: Family Medicine

## 2018-06-05 DIAGNOSIS — A6000 Herpesviral infection of urogenital system, unspecified: Secondary | ICD-10-CM

## 2018-06-05 MED ORDER — VALACYCLOVIR HCL 1 G PO TABS
1000.0000 mg | ORAL_TABLET | Freq: Every day | ORAL | 0 refills | Status: AC
Start: 2018-06-05 — End: 2018-06-10

## 2018-06-05 NOTE — Addendum Note (Signed)
Addended by: Shelly Coss on: 06/05/2018 12:11 PM   Modules accepted: Orders

## 2018-06-05 NOTE — Telephone Encounter (Signed)
Per Dr Ilda Basset, may send in valtrex 1000mg  daily x 5 day #20 no refills. Called & informed patient. Patient verbalized understanding & had no questions.

## 2018-06-05 NOTE — Telephone Encounter (Signed)
Patient called in to get a refill on her acyclovir (ZOVIRAX) prescription.

## 2018-06-15 ENCOUNTER — Other Ambulatory Visit: Payer: Self-pay

## 2018-06-19 LAB — CYTOLOGY - PAP: Diagnosis: NEGATIVE

## 2021-11-02 ENCOUNTER — Other Ambulatory Visit: Payer: Self-pay

## 2021-11-02 ENCOUNTER — Encounter (HOSPITAL_COMMUNITY): Payer: Self-pay

## 2021-11-02 ENCOUNTER — Ambulatory Visit (HOSPITAL_COMMUNITY)
Admission: EM | Admit: 2021-11-02 | Discharge: 2021-11-02 | Disposition: A | Discharge status: Home / Self Care | Payer: Self-pay | Attending: Family Medicine | Admitting: Family Medicine

## 2021-11-02 DIAGNOSIS — J019 Acute sinusitis, unspecified: Secondary | ICD-10-CM

## 2021-11-02 MED ORDER — IBUPROFEN 800 MG PO TABS: 800.0000 mg | ORAL_TABLET | Freq: Three times a day (TID) | ORAL | 0 refills | Status: AC | PRN

## 2021-11-02 MED ORDER — AMOXICILLIN 875 MG PO TABS: 875.0000 mg | ORAL_TABLET | Freq: Two times a day (BID) | ORAL | 0 refills | Status: DC

## 2021-11-02 MED ORDER — AMOXICILLIN 875 MG PO TABS: 875.0000 mg | ORAL_TABLET | Freq: Two times a day (BID) | ORAL | 0 refills | Status: AC

## 2021-11-02 NOTE — ED Provider Notes (Signed)
?[place] ? ? ? ?CSN: [id number] ?Arrival date & time: 11/02/21  0941 ? ? ?  ? ?History   ?Chief Complaint ?No chief complaint on file. ? ? ?HPI ?Bridget Wilson is a 36 y.o. female.  ? ?HPI ?Here for a history of cold symptoms and sore throat. ?About 10 to 12 days ago she began having cough with nasal congestion and rhinorrhea.  Most of those symptoms improved about 3 days ago, but she has continued to have sore throat.  Also she has some postnasal discharge and may be some sinus pressure.  No fever or chills.  No vomiting or diarrhea. ?Last menstrual period was about 2 weeks ago. ? ?Past Medical History:  ?Diagnosis Date  ? Anxiety   ? little bit  ? Chlamydia   ? ? ?Patient Active Problem List  ? Diagnosis Date Noted  ? Abnormal uterine bleeding (AUB) 12/05/2016  ? ? ?Past Surgical History:  ?Procedure Laterality Date  ? CHOLECYSTECTOMY    ? CHOLECYSTECTOMY, LAPAROSCOPIC    ? teeth pulled    ? ? ?OB History   ? ? Gravida  ?2  ? Para  ?2  ? Term  ?2  ? Preterm  ?0  ? AB  ?0  ? Living  ?2  ?  ? ? SAB  ?0  ? IAB  ?0  ? Ectopic  ?0  ? Multiple  ?0  ? Live Births  ?1  ?   ?  ?  ? ? ? ?Home Medications   ? ?Prior to Admission medications   ?Medication Sig Start Date End Date Taking? Authorizing Provider  ?ibuprofen (ADVIL) 800 MG tablet Take 1 tablet (800 mg total) by mouth every 8 (eight) hours as needed (pain). 11/02/21  Yes Barrett Henle, MD  ?acyclovir (ZOVIRAX) 800 MG tablet take 1 tablet by mouth twice a day 08/01/17   Woodroe Mode, MD  ?amoxicillin (AMOXIL) 875 MG tablet Take 1 tablet (875 mg total) by mouth 2 (two) times daily for 10 days. 11/02/21 11/12/21  Barrett Henle, MD  ?megestrol (MEGACE) 40 MG tablet Take 1 tablet (40 mg total) by mouth 2 (two) times daily. 01/08/17   Constant, Peggy, MD  ?norethindrone-ethinyl estradiol 1/35 (ORTHO-NOVUM, NORTREL,CYCLAFEM) tablet Take 3 tabs x 3 days, 2 tabs x 3 day then 1 tab daily. Skip placebo pills & start new pack taking 1 tab daily 03/19/17    Chancy Milroy, MD  ? ? ?Family History ?Family History  ?Problem Relation Age of Onset  ? Hypertension Mother   ? Diabetes Mother   ? Anesthesia problems Neg Hx   ? Hypotension Neg Hx   ? Malignant hyperthermia Neg Hx   ? Pseudochol deficiency Neg Hx   ? ? ?Social History ?Social History  ? ?Tobacco Use  ? Smoking status: Every Day  ?  Packs/day: 0.15  ?  Types: Cigarettes  ? Smokeless tobacco: Never  ? Tobacco comments:  ?  quit 08/12  ?Substance Use Topics  ? Alcohol use: Yes  ?  Comment: sometimes  ? Drug use: No  ? ? ? ?Allergies   ?Patient has no known allergies. ? ? ?Review of Systems ?Review of Systems ? ? ?Physical Exam ?Triage Vital Signs ?ED Triage Vitals  ?Enc Vitals Group  ?   BP 11/02/21 1132 (!) 157/89  ?   Pulse Rate 11/02/21 1132 85  ?   Resp 11/02/21 1132 16  ?   Temp 11/02/21 1134 97.9 ?  F (36.6 ?C)  ?   Temp src --   ?   SpO2 11/02/21 1132 100 %  ?   Weight --   ?   Height --   ?   Head Circumference --   ?   Peak Flow --   ?   Pain Score --   ?   Pain Loc --   ?   Pain Edu? --   ?   Excl. in [place]? --   ? ?No data found. ? ?Updated Vital Signs ?BP (!) 157/89 (BP Location: Left Arm)   Pulse 85   Temp 97.9 ?F (36.6 ?C)   Resp 16   SpO2 100%  ? ?Visual Acuity ?Right Eye Distance:   ?Left Eye Distance:   ?Bilateral Distance:   ? ?Right Eye Near:   ?Left Eye Near:    ?Bilateral Near:    ? ?Physical Exam ?Vitals reviewed.  ?Constitutional:   ?   General: She is not in acute distress. ?   Appearance: She is not toxic-appearing.  ?HENT:  ?   Right Ear: Tympanic membrane and ear canal normal.  ?   Left Ear: Tympanic membrane and ear canal normal.  ?   Nose: Nose normal.  ?   Mouth/Throat:  ?   Mouth: Mucous membranes are moist.  ?   Comments: Copious clear mucus draining in OP  ?Eyes:  ?   Extraocular Movements: Extraocular movements intact.  ?   Conjunctiva/sclera: Conjunctivae normal.  ?   Pupils: Pupils are equal, round, and reactive to light.  ?Cardiovascular:  ?   Rate and Rhythm: Normal rate and  regular rhythm.  ?   Heart sounds: No murmur heard. ?Pulmonary:  ?   Effort: Pulmonary effort is normal. No respiratory distress.  ?   Breath sounds: No wheezing, rhonchi or rales.  ?Chest:  ?   Chest wall: No tenderness.  ?Musculoskeletal:  ?   Cervical back: Neck supple.  ?Lymphadenopathy:  ?   Cervical: No cervical adenopathy.  ?Skin: ?   Capillary Refill: Capillary refill takes less than 2 seconds.  ?   Coloration: Skin is not jaundiced or pale.  ?Neurological:  ?   General: No focal deficit present.  ?   Mental Status: She is alert and oriented to person, place, and time.  ?Psychiatric:     ?   Behavior: Behavior normal.  ? ? ? ?UC Treatments / Results  ?Labs ?(all labs ordered are listed, but only abnormal results are displayed) ?Labs Reviewed - No data to display ? ?EKG ? ? ?Radiology ?No results found. ? ?Procedures ?Procedures (including critical care time) ? ?Medications Ordered in UC ?Medications - No data to display ? ?Initial Impression / Assessment and Plan / UC Course  ?I have reviewed the triage vital signs and the nursing notes. ? ?Pertinent labs & imaging results that were available during my care of the patient were reviewed by me and considered in my medical decision making (see chart for details). ? ?  ? ?We will treat for acute sinusitis with the length of her symptoms. ?Final Clinical Impressions(s) / UC Diagnoses  ? ?Final diagnoses:  ?Acute sinusitis, recurrence not specified, unspecified location  ? ? ? ?Discharge Instructions   ? ?  ?Take amoxicillin 875 mg, 1 tab twice daily for 10 days. ? ?He can take ibuprofen 800 mg 1 every 8 hours as needed for pain ? ? ? ? ?ED Prescriptions   ? ? Medication Sig  Dispense Auth. Provider  ? amoxicillin (AMOXIL) 875 MG tablet  (Status: Discontinued) Take 1 tablet (875 mg total) by mouth 2 (two) times daily for 7 days. 14 tablet Mumtaz Lovins, Gwenlyn Perking, MD  ? ibuprofen (ADVIL) 800 MG tablet Take 1 tablet (800 mg total) by mouth every 8 (eight) hours as needed  (pain). 21 tablet Barrett Henle, MD  ? amoxicillin (AMOXIL) 875 MG tablet Take 1 tablet (875 mg total) by mouth 2 (two) times daily for 10 days. 20 tablet Barrett Henle, MD  ? ?  ? ?PDMP not reviewed this encounter. ?  ?Barrett Henle, MD ?11/02/21 1212 ? ?

## 2021-11-02 NOTE — ED Triage Notes (Signed)
Pt reports coughing x 1 week.  ?She reports her throat has started to hurt for several days.  ?

## 2021-11-02 NOTE — Discharge Instructions (Signed)
Take amoxicillin 875 mg, 1 tab twice daily for 10 days. ? ?He can take ibuprofen 800 mg 1 every 8 hours as needed for pain ?
